# Patient Record
Sex: Female | Born: 1970 | Race: White | Hispanic: No | Marital: Married | Smoking: Never smoker
Health system: Southern US, Community
[De-identification: ages and names within clinical notes are randomized; demographics above are authoritative.]

## PROBLEM LIST (undated history)

## (undated) DIAGNOSIS — G4700 Insomnia, unspecified: Secondary | ICD-10-CM

## (undated) DIAGNOSIS — R030 Elevated blood-pressure reading, without diagnosis of hypertension: Secondary | ICD-10-CM

## (undated) DIAGNOSIS — Z8742 Personal history of other diseases of the female genital tract: Secondary | ICD-10-CM

## (undated) DIAGNOSIS — F419 Anxiety disorder, unspecified: Secondary | ICD-10-CM

## (undated) DIAGNOSIS — Z8719 Personal history of other diseases of the digestive system: Secondary | ICD-10-CM

## (undated) DIAGNOSIS — M4316 Spondylolisthesis, lumbar region: Secondary | ICD-10-CM

## (undated) DIAGNOSIS — N3010 Interstitial cystitis (chronic) without hematuria: Secondary | ICD-10-CM

## (undated) DIAGNOSIS — R112 Nausea with vomiting, unspecified: Secondary | ICD-10-CM

## (undated) DIAGNOSIS — E781 Pure hyperglyceridemia: Secondary | ICD-10-CM

## (undated) DIAGNOSIS — Z9889 Other specified postprocedural states: Secondary | ICD-10-CM

## (undated) DIAGNOSIS — J302 Other seasonal allergic rhinitis: Secondary | ICD-10-CM

## (undated) DIAGNOSIS — R399 Unspecified symptoms and signs involving the genitourinary system: Secondary | ICD-10-CM

## (undated) DIAGNOSIS — R3989 Other symptoms and signs involving the genitourinary system: Secondary | ICD-10-CM

## (undated) DIAGNOSIS — E785 Hyperlipidemia, unspecified: Secondary | ICD-10-CM

## (undated) DIAGNOSIS — K219 Gastro-esophageal reflux disease without esophagitis: Secondary | ICD-10-CM

## (undated) DIAGNOSIS — E559 Vitamin D deficiency, unspecified: Secondary | ICD-10-CM

## (undated) HISTORY — DX: Anxiety disorder, unspecified: F41.9

## (undated) HISTORY — DX: Hyperlipidemia, unspecified: E78.5

## (undated) HISTORY — DX: Insomnia, unspecified: G47.00

---

## 1994-07-15 HISTORY — PX: OTHER SURGICAL HISTORY: SHX169

## 1998-09-01 ENCOUNTER — Encounter: Payer: Self-pay | Admitting: Obstetrics and Gynecology

## 1998-09-01 ENCOUNTER — Ambulatory Visit (HOSPITAL_COMMUNITY): Admission: RE | Admit: 1998-09-01 | Discharge: 1998-09-01 | Payer: Self-pay | Admitting: Obstetrics and Gynecology

## 2000-04-23 ENCOUNTER — Other Ambulatory Visit: Admission: RE | Admit: 2000-04-23 | Discharge: 2000-04-23 | Payer: Self-pay | Admitting: Gynecology

## 2000-08-25 ENCOUNTER — Other Ambulatory Visit: Admission: RE | Admit: 2000-08-25 | Discharge: 2000-08-25 | Payer: Self-pay | Admitting: Gynecology

## 2001-03-31 ENCOUNTER — Ambulatory Visit (HOSPITAL_COMMUNITY): Admission: RE | Admit: 2001-03-31 | Discharge: 2001-03-31 | Payer: Self-pay | Admitting: Obstetrics and Gynecology

## 2001-03-31 ENCOUNTER — Encounter: Payer: Self-pay | Admitting: Obstetrics and Gynecology

## 2001-07-22 ENCOUNTER — Inpatient Hospital Stay (HOSPITAL_COMMUNITY): Admission: AD | Admit: 2001-07-22 | Discharge: 2001-07-22 | Payer: Self-pay | Admitting: Obstetrics and Gynecology

## 2001-07-29 ENCOUNTER — Inpatient Hospital Stay (HOSPITAL_COMMUNITY): Admission: AD | Admit: 2001-07-29 | Discharge: 2001-07-29 | Payer: Self-pay | Admitting: Obstetrics and Gynecology

## 2001-08-16 ENCOUNTER — Inpatient Hospital Stay (HOSPITAL_COMMUNITY): Admission: AD | Admit: 2001-08-16 | Discharge: 2001-08-18 | Payer: Self-pay | Admitting: Obstetrics and Gynecology

## 2001-09-25 ENCOUNTER — Other Ambulatory Visit: Admission: RE | Admit: 2001-09-25 | Discharge: 2001-09-25 | Payer: Self-pay | Admitting: Obstetrics and Gynecology

## 2002-12-30 ENCOUNTER — Other Ambulatory Visit: Admission: RE | Admit: 2002-12-30 | Discharge: 2002-12-30 | Payer: Self-pay | Admitting: Obstetrics and Gynecology

## 2003-01-04 ENCOUNTER — Encounter: Admission: RE | Admit: 2003-01-04 | Discharge: 2003-01-04 | Payer: Self-pay | Admitting: Obstetrics and Gynecology

## 2003-01-04 ENCOUNTER — Encounter: Payer: Self-pay | Admitting: Obstetrics and Gynecology

## 2004-01-13 ENCOUNTER — Ambulatory Visit (HOSPITAL_COMMUNITY): Admission: RE | Admit: 2004-01-13 | Discharge: 2004-01-13 | Payer: Self-pay | Admitting: Obstetrics and Gynecology

## 2004-01-13 ENCOUNTER — Encounter (INDEPENDENT_AMBULATORY_CARE_PROVIDER_SITE_OTHER): Payer: Self-pay | Admitting: Specialist

## 2004-01-13 HISTORY — PX: DILITATION & CURRETTAGE/HYSTROSCOPY WITH NOVASURE ABLATION: SHX5568

## 2004-01-31 ENCOUNTER — Other Ambulatory Visit: Admission: RE | Admit: 2004-01-31 | Discharge: 2004-01-31 | Payer: Self-pay | Admitting: Obstetrics and Gynecology

## 2004-05-21 ENCOUNTER — Emergency Department (HOSPITAL_COMMUNITY): Admission: EM | Admit: 2004-05-21 | Discharge: 2004-05-21 | Payer: Self-pay

## 2005-10-24 ENCOUNTER — Emergency Department (HOSPITAL_COMMUNITY): Admission: EM | Admit: 2005-10-24 | Discharge: 2005-10-25 | Payer: Self-pay | Admitting: Emergency Medicine

## 2005-11-21 ENCOUNTER — Emergency Department (HOSPITAL_COMMUNITY): Admission: EM | Admit: 2005-11-21 | Discharge: 2005-11-22 | Payer: Self-pay | Admitting: Emergency Medicine

## 2005-11-22 ENCOUNTER — Other Ambulatory Visit (HOSPITAL_COMMUNITY): Admission: RE | Admit: 2005-11-22 | Discharge: 2005-12-06 | Payer: Self-pay | Admitting: Psychiatry

## 2005-11-22 ENCOUNTER — Ambulatory Visit: Payer: Self-pay | Admitting: Psychiatry

## 2005-12-11 ENCOUNTER — Ambulatory Visit (HOSPITAL_COMMUNITY): Payer: Self-pay | Admitting: Psychiatry

## 2005-12-30 ENCOUNTER — Ambulatory Visit (HOSPITAL_COMMUNITY): Payer: Self-pay | Admitting: Psychiatry

## 2006-01-27 ENCOUNTER — Ambulatory Visit (HOSPITAL_COMMUNITY): Payer: Self-pay | Admitting: Psychiatry

## 2006-04-03 ENCOUNTER — Other Ambulatory Visit: Admission: RE | Admit: 2006-04-03 | Discharge: 2006-04-03 | Payer: Self-pay | Admitting: Obstetrics and Gynecology

## 2006-05-16 ENCOUNTER — Ambulatory Visit (HOSPITAL_COMMUNITY): Payer: Self-pay | Admitting: Psychiatry

## 2006-09-05 ENCOUNTER — Inpatient Hospital Stay (HOSPITAL_COMMUNITY): Admission: AD | Admit: 2006-09-05 | Discharge: 2006-09-08 | Payer: Self-pay | Admitting: Obstetrics and Gynecology

## 2006-09-05 ENCOUNTER — Encounter (INDEPENDENT_AMBULATORY_CARE_PROVIDER_SITE_OTHER): Payer: Self-pay | Admitting: Specialist

## 2006-09-05 ENCOUNTER — Encounter: Payer: Self-pay | Admitting: Emergency Medicine

## 2006-09-06 HISTORY — PX: TOTAL ABDOMINAL HYSTERECTOMY W/ BILATERAL SALPINGOOPHORECTOMY: SHX83

## 2006-10-13 ENCOUNTER — Ambulatory Visit (HOSPITAL_COMMUNITY): Payer: Self-pay | Admitting: Psychiatry

## 2007-01-05 ENCOUNTER — Ambulatory Visit (HOSPITAL_COMMUNITY): Payer: Self-pay | Admitting: Psychiatry

## 2007-02-11 ENCOUNTER — Ambulatory Visit (HOSPITAL_COMMUNITY): Payer: Self-pay | Admitting: Psychiatry

## 2007-03-04 ENCOUNTER — Emergency Department (HOSPITAL_COMMUNITY): Admission: EM | Admit: 2007-03-04 | Discharge: 2007-03-04 | Payer: Self-pay | Admitting: Emergency Medicine

## 2007-08-05 ENCOUNTER — Ambulatory Visit (HOSPITAL_COMMUNITY): Payer: Self-pay | Admitting: Psychiatry

## 2007-10-02 ENCOUNTER — Encounter: Admission: RE | Admit: 2007-10-02 | Discharge: 2007-10-02 | Payer: Self-pay | Admitting: Orthopaedic Surgery

## 2007-10-23 ENCOUNTER — Encounter: Admission: RE | Admit: 2007-10-23 | Discharge: 2007-10-23 | Payer: Self-pay | Admitting: Orthopaedic Surgery

## 2008-02-08 ENCOUNTER — Ambulatory Visit (HOSPITAL_COMMUNITY): Payer: Self-pay | Admitting: Psychiatry

## 2008-02-09 HISTORY — PX: LUMBAR FUSION: SHX111

## 2009-09-12 ENCOUNTER — Encounter: Payer: Self-pay | Admitting: Family Medicine

## 2009-09-22 ENCOUNTER — Ambulatory Visit (HOSPITAL_BASED_OUTPATIENT_CLINIC_OR_DEPARTMENT_OTHER): Admission: RE | Admit: 2009-09-22 | Discharge: 2009-09-22 | Payer: Self-pay | Admitting: Orthopedic Surgery

## 2009-09-22 HISTORY — PX: CARPAL TUNNEL WITH CUBITAL TUNNEL: SHX5608

## 2009-11-30 ENCOUNTER — Encounter: Admission: RE | Admit: 2009-11-30 | Discharge: 2009-11-30 | Payer: Self-pay | Admitting: Obstetrics and Gynecology

## 2010-08-05 ENCOUNTER — Encounter: Payer: Self-pay | Admitting: Family Medicine

## 2010-09-25 ENCOUNTER — Ambulatory Visit: Payer: Self-pay | Admitting: Family Medicine

## 2010-09-28 ENCOUNTER — Encounter: Payer: Self-pay | Admitting: Family Medicine

## 2010-09-28 ENCOUNTER — Ambulatory Visit (INDEPENDENT_AMBULATORY_CARE_PROVIDER_SITE_OTHER): Payer: Self-pay | Admitting: Family Medicine

## 2010-09-28 DIAGNOSIS — N83209 Unspecified ovarian cyst, unspecified side: Secondary | ICD-10-CM | POA: Insufficient documentation

## 2010-09-28 DIAGNOSIS — F411 Generalized anxiety disorder: Secondary | ICD-10-CM

## 2010-09-28 DIAGNOSIS — E781 Pure hyperglyceridemia: Secondary | ICD-10-CM | POA: Insufficient documentation

## 2010-09-28 DIAGNOSIS — N3010 Interstitial cystitis (chronic) without hematuria: Secondary | ICD-10-CM | POA: Insufficient documentation

## 2010-09-28 DIAGNOSIS — G43909 Migraine, unspecified, not intractable, without status migrainosus: Secondary | ICD-10-CM | POA: Insufficient documentation

## 2010-09-28 DIAGNOSIS — K219 Gastro-esophageal reflux disease without esophagitis: Secondary | ICD-10-CM

## 2010-09-28 DIAGNOSIS — E663 Overweight: Secondary | ICD-10-CM | POA: Insufficient documentation

## 2010-09-28 DIAGNOSIS — R0789 Other chest pain: Secondary | ICD-10-CM | POA: Insufficient documentation

## 2010-10-01 ENCOUNTER — Encounter: Payer: Self-pay | Admitting: Family Medicine
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
## 2010-10-07 LAB — POCT HEMOGLOBIN-HEMACUE: Hemoglobin: 12.6 g/dL (ref 12.0–15.0)

## 2010-10-08 ENCOUNTER — Telehealth: Payer: Self-pay | Admitting: Family Medicine

## 2010-10-08 MED ORDER — FLUCONAZOLE 150 MG PO TABS: 150.0000 mg | ORAL_TABLET | Freq: Once | ORAL | Status: AC

## 2010-10-08 NOTE — Telephone Encounter (Signed)
Pt has developed a yeast infection from antibiotics, pls call in a Rx to [facility]

## 2010-10-08 NOTE — Telephone Encounter (Signed)
Please advise 

## 2010-10-08 NOTE — Telephone Encounter (Signed)
OK, have prescribed Diflucan please notify patient and encourage her to take a probiotic daily for the next couple of weeks, OTC

## 2010-10-09 NOTE — Telephone Encounter (Signed)
Pt informed

## 2010-10-09 NOTE — Telephone Encounter (Signed)
Message copied by Lannette Donath on Tue Oct 09, 2010 10:48 AM ------      Message from: Danise Edge      Created: Fri Oct 05, 2010  1:03 PM      Regarding: RE: Brenton Grills      Contact: [phone]       Paps, mgms and labs for last 5 years, any surgeries      ----- Message -----         From: Lannette Donath         Sent: 10/04/2010   4:37 PM           To: Danise Edge      Subject: [facility]                                               Bonita Quin from medical records at [facility] called and said their medical records date back to 1995, there are A LOT of records, do you want all of them? I told her I would call her back to let her know.

## 2010-10-29 ENCOUNTER — Other Ambulatory Visit (HOSPITAL_COMMUNITY)
Admission: RE | Admit: 2010-10-29 | Discharge: 2010-10-29 | Disposition: A | Discharge status: Home / Self Care | Payer: BC Managed Care – PPO | Source: Ambulatory Visit | Attending: Family Medicine | Admitting: Family Medicine

## 2010-10-29 ENCOUNTER — Ambulatory Visit (INDEPENDENT_AMBULATORY_CARE_PROVIDER_SITE_OTHER): Payer: BC Managed Care – PPO | Admitting: Family Medicine

## 2010-10-29 ENCOUNTER — Encounter: Payer: Self-pay | Admitting: Family Medicine

## 2010-10-29 DIAGNOSIS — Z01419 Encounter for gynecological examination (general) (routine) without abnormal findings: Secondary | ICD-10-CM | POA: Insufficient documentation

## 2010-10-29 DIAGNOSIS — N3010 Interstitial cystitis (chronic) without hematuria: Secondary | ICD-10-CM

## 2010-10-29 DIAGNOSIS — Z0000 Encounter for general adult medical examination without abnormal findings: Secondary | ICD-10-CM | POA: Insufficient documentation

## 2010-10-29 DIAGNOSIS — F411 Generalized anxiety disorder: Secondary | ICD-10-CM

## 2010-10-29 DIAGNOSIS — K219 Gastro-esophageal reflux disease without esophagitis: Secondary | ICD-10-CM

## 2010-10-29 DIAGNOSIS — E781 Pure hyperglyceridemia: Secondary | ICD-10-CM

## 2010-10-29 DIAGNOSIS — IMO0001 Reserved for inherently not codable concepts without codable children: Secondary | ICD-10-CM

## 2010-10-29 DIAGNOSIS — F419 Anxiety disorder, unspecified: Secondary | ICD-10-CM

## 2010-10-29 DIAGNOSIS — E663 Overweight: Secondary | ICD-10-CM

## 2010-10-29 DIAGNOSIS — G43909 Migraine, unspecified, not intractable, without status migrainosus: Secondary | ICD-10-CM

## 2010-10-29 DIAGNOSIS — N83209 Unspecified ovarian cyst, unspecified side: Secondary | ICD-10-CM

## 2010-10-29 DIAGNOSIS — E785 Hyperlipidemia, unspecified: Secondary | ICD-10-CM

## 2010-10-29 DIAGNOSIS — R0789 Other chest pain: Secondary | ICD-10-CM

## 2010-10-29 LAB — CBC WITH DIFFERENTIAL/PLATELET
Basophils Absolute: 0 10*3/uL (ref 0.0–0.1)
Basophils Relative: 0.4 % (ref 0.0–3.0)
Eosinophils Absolute: 0.1 10*3/uL (ref 0.0–0.7)
Lymphocytes Relative: 36.9 % (ref 12.0–46.0)
MCV: 92.2 fl (ref 78.0–100.0)
Monocytes Absolute: 0.4 10*3/uL (ref 0.1–1.0)
Monocytes Relative: 5.2 % (ref 3.0–12.0)
RBC: 4.13 Mil/uL (ref 3.87–5.11)
RDW: 12.8 % (ref 11.5–14.6)

## 2010-10-29 LAB — RENAL FUNCTION PANEL
CO2: 27 mEq/L (ref 19–32)
Calcium: 9.3 mg/dL (ref 8.4–10.5)
Chloride: 105 mEq/L (ref 96–112)
Creatinine, Ser: 0.8 mg/dL (ref 0.4–1.2)
GFR: 89.9 mL/min (ref >60.00)
Glucose, Bld: 84 mg/dL (ref 70–99)

## 2010-10-29 LAB — LIPID PANEL
HDL: 53.2 mg/dL (ref >39.00)
Triglycerides: 233 mg/dL — ABNORMAL HIGH (ref 0.0–149.0)
VLDL: 46.6 mg/dL — ABNORMAL HIGH (ref 0.0–40.0)

## 2010-10-29 LAB — HEPATIC FUNCTION PANEL
Albumin: 3.8 g/dL (ref 3.5–5.2)
Alkaline Phosphatase: 37 U/L — ABNORMAL LOW (ref 39–117)
Total Protein: 7 g/dL (ref 6.0–8.3)

## 2010-10-29 LAB — LDL CHOLESTEROL, DIRECT: Direct LDL: 146 mg/dL

## 2010-10-29 LAB — TSH: TSH: 0.71 u[IU]/mL (ref 0.35–5.50)

## 2010-10-29 MED ORDER — ALPRAZOLAM 0.25 MG PO TABS: 0.2500 mg | ORAL_TABLET | Freq: Two times a day (BID) | ORAL | Status: DC | PRN

## 2010-10-29 NOTE — Assessment & Plan Note (Signed)
Good improvement with addition of Atenolol. Only 1 HA since last seen, will continue current dosing, reiterated lifestyle issues such as sleep and diet as well. Call with any concerns

## 2010-10-29 NOTE — Assessment & Plan Note (Signed)
Doing well, did use OTC Zantac with good results rarely since last visit, encouraged her to continue the same. Avoid offending foods.

## 2010-10-29 NOTE — Assessment & Plan Note (Addendum)
Pap taken today, will proceed with vaginal exam every 3 years if pap is normal and patient without any new concerns. Encouraged MGM every 1-2 years after age 40 yrs patient in agreement

## 2010-10-29 NOTE — Progress Notes (Signed)
Brittney Townsend [id number] 1970/10/05 10/29/2010      Progress Note-Follow Up  Subjective  Chief Complaint  Chief Complaint  Patient presents with  . Gynecologic Exam    pap smear    HPI  Patient is a 40 year old female in today for follow up of multiple medical concerns and annual Pap smear. Has not had a Pap smear in a number of years secondary to a total hysterectomy. Did have some abnormal Pap smears ago required colposcopy but ultimately had her total abdominal hysterectomy for endometriosis and painful ovarian cysts. She was on oral contraceptives for roughly 3-5 years. Since her last visit she had she feels significantly better. Her migraines have dropped to 1 for less than a month. She feels less anxious she denies any chest pain. Her heartburn is well-controlled. No recent febrile illness, chills, chest pain, palpitations, shortness of breath, GI or GU complaints at this time. She's been trying to avoid offending foods and taking her Tessalon with good effect. She says that Tylenol has helped her headaches dramatically but Imitrex still works when she needs it. She is eating very little alprazolam for her anxiety when she has needed it has been effective without any concerning side effects.  Past Medical History  Diagnosis Date  . Allergy     seasonal  . Anxiety   . Migraines     Past Surgical History  Procedure Date  . Abdominal hysterectomy 2008    total  . Back surgery 2009    L5 fracture required pins, rod, fusion L4-L5 fusion  . Elbow surgery 2011    carpal tunnel release on right  . Cesarean section 1998, 2003    Family History  Problem Relation Age of Onset  . Hypertension Mother   . Depression Mother   . Hyperlipidemia Father   . Diabetes Father     type 2  . Cancer Maternal Grandmother     Lung/ previous smoker  . Hypertension Maternal Grandmother   . Hyperlipidemia Maternal Grandmother   . Other Maternal Grandfather     renal failure  . Heart disease  Maternal Grandfather   . Hypertension Maternal Grandfather   . Hyperlipidemia Maternal Grandfather   . Diabetes Paternal Grandmother   . Hypertension Paternal Grandmother   . Hyperlipidemia Paternal Grandmother   . Hypertension Paternal Grandfather   . Hyperlipidemia Paternal Grandfather   . Cancer Paternal Grandfather     skin  . Depression Brother   . Obesity Son   . Heart attack Paternal Aunt     smoker  . Heart attack Paternal Uncle     smoker    History   Social History  . Marital Status: Married    Spouse Name: N/A    Number of Children: N/A  . Years of Education: N/A   Occupational History  . Not on file.   Social History Main Topics  . Smoking status: Never Smoker   . Smokeless tobacco: Never Used  . Alcohol Use: Yes     rare, special occasions  . Drug Use: No  . Sexually Active: Yes -- Female partner(s)   Other Topics Concern  . Not on file   Social History Narrative  . No narrative on file    Current Outpatient Prescriptions on File Prior to Visit  Medication Sig Dispense Refill  . atenolol (TENORMIN) 25 MG tablet Take 25 mg by mouth daily. 1/2 to 1 tab po daily as tolerated       . clindamycin-benzoyl  peroxide (BENZACLIN) gel Apply topically daily. Apply to lesions daily as tolerated       . dexlansoprazole (DEXILANT) 60 MG capsule Take 60 mg by mouth daily.        Marland Kitchen estrogens, conjugated, (PREMARIN) 1.25 MG tablet Take 1.25 mg by mouth daily.        . Meth-Hyo-M Bl-Na Phos-Ph Sal (PHOSPHASAL) 81.6 MG TABS Take by mouth daily.        . minocycline (MINOCIN,DYNACIN) 50 MG capsule Take 50 mg by mouth 2 (two) times daily.        . pentosan polysulfate (ELMIRON) 100 MG capsule Take 100 mg by mouth 3 (three) times daily before meals.        . Probiotic Product (PRO-FLORA CONCENTRATE) CAPS Take 1 capsule by mouth daily.        Enid Baas & Cleansers (CETAPHIL GENTLE CLEANSING) BAR Apply topically. Wash daily affected       . SUMAtriptan (IMITREX) 100 MG tablet  Take 100 mg by mouth every 2 (two) hours as needed.        . traZODone (DESYREL) 50 MG tablet Take 50 mg by mouth at bedtime.        Marland Kitchen DISCONTD: ALPRAZolam (XANAX) 0.25 MG tablet Take 0.25 mg by mouth 2 (two) times daily as needed. 1/2 to 1 tab po bid prn       . escitalopram (LEXAPRO) 10 MG tablet Take 10 mg by mouth daily. 1/2 tab po daily X 7 days then quit          Allergies  Allergen Reactions  . Sulfonamide Derivatives     REACTION: hives    Review of Systems  Review of Systems  Constitutional: Negative for fever and malaise/fatigue.  HENT: Negative for congestion.   Eyes: Negative for discharge.  Respiratory: Negative for shortness of breath.   Cardiovascular: Negative for chest pain, palpitations and leg swelling.  Gastrointestinal: Negative for nausea, vomiting, abdominal pain, diarrhea, constipation and blood in stool.  Genitourinary: Negative for dysuria, urgency, frequency, hematuria and flank pain.       [G2P2 s/p 2 C/S. S/p hysterectomy in 2007 for painful ruptured ovarian cysts and endometriosis, menarche at 40 yo and regular but painful and heavy in past. Distant h/o abnormal pap normalized after coloposcopy and never recurred. Has had a couple of MGM for a lump found by other MD, normal on MGM and no breast or GYN c/o today. No discharge or lesions, sexually active only with husband Musculoskeletal: Negative for falls.  Skin: Negative for rash.  Neurological: Negative for loss of consciousness and headaches.  Endo/Heme/Allergies: Negative for polydipsia.  Psychiatric/Behavioral: Negative for depression and suicidal ideas. The patient is not nervous/anxious and does not have insomnia.     Objective  BP 126/80  Pulse 79  Temp(Src) 98 F (36.7 C) (Oral)  Ht 5\' 3"  (1.6 m)  Wt 161 lb (73.029 kg)  BMI 28.52 kg/m2  SpO2 100%  Physical Exam  Physical Exam  Constitutional: She is oriented to person, place, and time and well-developed, well-nourished, and in no  distress. No distress.  HENT:  Head: Normocephalic and atraumatic.  Eyes: Conjunctivae are normal.  Neck: Neck supple. No thyromegaly present.  Cardiovascular: Normal rate, regular rhythm and normal heart sounds.   No murmur heard. Pulmonary/Chest: Effort normal and breath sounds normal. She has no wheezes.  Abdominal: She exhibits no distension and no mass.  Genitourinary: Vagina normal, right adnexa normal and left adnexa normal. No vaginal discharge  found.       No cervix or uterus noted by visualization or palpation  Musculoskeletal: She exhibits no edema.  Lymphadenopathy:    She has no cervical adenopathy.  Neurological: She is alert and oriented to person, place, and time.  Skin: Skin is warm and dry. No rash noted. She is not diaphoretic.  Psychiatric: Memory, affect and judgment normal.      Assessment & Plan   INTERSTITIAL CYSTITIS Asymptomatic at this time, encouraged good hydration and avoid harsh soaps, call with  Any concerns  MIGRAINE HEADACHE Good improvement with addition of Atenolol. Only 1 HA since last seen, will continue current dosing, reiterated lifestyle issues such as sleep and diet as well. Call with any concerns  HYPERTRIGLYCERIDEMIA Reviewed labs from previous PMD with patient and encouraged to avoid trans fats and increase exercise, start fish oil and fiber supplements. Check FLP today  GERD Doing well, did use OTC Zantac with good results rarely since last visit, encouraged her to continue the same. Avoid offending foods.  Preventative health care Pat taken today, will proceed with vaginal exam every 3 years if pap is normal and patient without any new concerns. Encouraged MGM every 1-2 years after age 12 yrs patient in agreement   Anxiety Disorder Symptoms improved at this time, continue current meds, use Alprazolam infrequently prn

## 2010-10-29 NOTE — Assessment & Plan Note (Signed)
Reviewed labs from previous PMD with patient and encouraged to avoid trans fats and increase exercise, start fish oil and fiber supplements. Check FLP today

## 2010-10-29 NOTE — Patient Instructions (Signed)
Hypertriglyceridemia  Diet for High blood levels of Triglycerides Most fats in food are triglycerides. Triglycerides in your blood are stored as fat in your body. High levels of triglycerides in your blood may put you at a greater risk for heart disease and stroke.  Normal triglyceride levels are less than 150 mg/dL. Borderline high levels are 150-199 mg/dl. High levels are 200 - 499 mg/dL, and very high triglyceride levels are greater than 500 mg/dL. The decision to treat high triglycerides is generally based on the level. For people with borderline or high triglyceride levels, treatment includes weight loss and exercise. Drugs are recommended for people with very high triglyceride levels. Many people who need treatment for high triglyceride levels have metabolic syndrome. This syndrome is a collection of disorders that often include: insulin resistance, high blood pressure, blood clotting problems, high cholesterol and triglycerides. TESTING PROCEDURE FOR TRIGLYCERIDES  You should not eat 4 hours before getting your triglycerides measured. The normal range of triglycerides is between 10 and 250 milligrams per deciliter (mg/dl). Some people may have extreme levels (1000 or above), but your triglyceride level may be too high if it is above 150 mg/dl, depending on what other risk factors you have for heart disease.   People with high blood triglycerides may also have high blood cholesterol levels. If you have high blood cholesterol as well as high blood triglycerides, your risk for heart disease is probably greater than if you only had high triglycerides. High blood cholesterol is one of the main risk factors for heart disease.  CHANGING YOUR DIET  Your weight can affect your blood triglyceride level. If you are more than 20% above your ideal body weight, you may be able to lower your blood triglycerides by losing weight. Eating less and exercising regularly is the best way to combat this. Fat provides  more calories than any other food. The best way to lose weight is to eat less fat. Only 30% of your total calories should come from fat. Less than 7% of your diet should come from saturated fat. A diet low in fat and saturated fat is the same as a diet to decrease blood cholesterol. By eating a diet lower in fat, you may lose weight, lower your blood cholesterol, and lower your blood triglyceride level.  Eating a diet low in fat, especially saturated fat, may also help you lower your blood triglyceride level. Ask your dietitian to help you figure how much fat you can eat based on the number of calories your caregiver has prescribed for you.  Exercise, in addition to helping with weight loss may also help lower triglyceride levels.   Alcohol can increase blood triglycerides. You may need to stop drinking alcoholic beverages.   Too much carbohydrate in your diet may also increase your blood triglycerides. Some complex carbohydrates are necessary in your diet. These may include bread, rice, potatoes, other starchy vegetables and cereals.   Reduce "simple" carbohydrates. These may include pure sugars, candy, honey, and jelly without losing other nutrients. If you have the kind of high blood triglycerides that is affected by the amount of carbohydrates in your diet, you will need to eat less sugar and less high-sugar foods. Your caregiver can help you with this.   Adding 2-4 grams of fish oil (EPA+ DHA) may also help lower triglycerides. Speak with your caregiver before adding any supplements to your regimen.  Following the Diet  Maintain your ideal weight. Your caregivers can help you with a diet. Generally,   eating less food and getting more exercise will help you lose weight. Joining a weight control group may also help. Ask your caregivers for a good weight control group in your area.  Eat low-fat foods instead of high-fat foods. This can help you lose weight too.  These foods are lower in fat. Eat MORE  of these:   Dried beans, peas, and lentils.   Egg whites.   Low-fat cottage cheese.   Fish.   Lean cuts of meat, such as round, sirloin, rump, and flank (cut extra fat off meat you fix).   Whole grain breads, cereals and pasta.   Skim and nonfat dry milk.   Low-fat yogurt.   Poultry without the skin.   Cheese made with skim or part-skim milk, such as mozzarella, parmesan, farmers', ricotta, or pot cheese.   These are higher fat foods. Eat LESS of these:   Whole milk and foods made from whole milk, such as American, blue, cheddar, monterey jack, and swiss cheese   High-fat meats, such as luncheon meats, sausages, knockwurst, bratwurst, hot dogs, ribs, corned beef, ground pork, and regular ground beef.   Fried foods.  Limit saturated fats in your diet. Substituting unsaturated fat for saturated fat may decrease your blood triglyceride level. You will need to read package labels to know which products contain saturated fats.  These foods are high in saturated fat. Eat LESS of these:   Fried pork skins.  Whole milk.   Skin and fat from poultry.   Palm oil.   Butter.   Shortening.   Cream cheese.   Berniece Salines.   Margarines and baked goods made from listed oils.   Vegetable shortenings.   Chitterlings.  Fat from meats.   Coconut oil.   Palm kernel oil.   Lard.   Cream.   Sour cream.   Fatback.   Coffee whiteners and non-dairy creamers made with these oils.   Cheese made from whole milk.   Use unsaturated fats (both polyunsaturated and monounsaturated) moderately. Remember, even though unsaturated fats are better than saturated fats; you still want a diet low in total fat.  These foods are high in unsaturated fat:   Canola oil.  Sunflower oil.   Mayonnaise.   Almonds.   Peanuts.   Pine nuts.   Margarines made with these oils.   Safflower oil.  Olive oil.   Avocados.   Cashews.   Peanut butter.   Sunflower seeds.   Soybean  oil.  Peanut oil.   Olives.   Pecans.   Walnuts.   Pumpkin seeds.   Avoid sugar and other high-sugar foods. This will decrease carbohydrates without decreasing other nutrients. Sugar in your food goes rapidly to your blood. When there is excess sugar in your blood, your liver may use it to make more triglycerides. Sugar also contains calories without other important nutrients.  Eat LESS of these:   Sugar, brown sugar, powdered sugar, jam, jelly, preserves, honey, syrup, molasses, pies, candy, cakes, cookies, frosting, pastries, colas, soft drinks, punches, fruit drinks, and regular gelatin.   Avoid alcohol. Alcohol, even more than sugar, may increase blood triglycerides. In addition, alcohol is high in calories and low in nutrients. Ask for sparkling water, or a diet soft drink instead of an alcoholic beverage.  Suggestions for planning and preparing meals   Bake, broil, grill or roast meats instead of frying.   Remove fat from meats and skin from poultry before cooking.   Add spices, herbs, lemon juice  or vinegar to vegetables instead of salt, rich sauces or gravies.   Use a non-stick skillet without fat or use no-stick sprays.   Cool and refrigerate stews and broth. Then remove the hardened fat floating on the surface before serving.   Refrigerate meat drippings and skim off fat to make low-fat gravies.   Serve more fish.   Use less butter, margarine and other high-fat spreads on bread or vegetables.   Use skim or reconstituted non-fat dry milk for cooking.   Cook with low-fat cheeses.   Substitute low-fat yogurt or cottage cheese for all or part of the sour cream in recipes for sauces, dips or congealed salads.   Use half yogurt/half mayonnaise in salad recipes.   Substitute evaporated skim milk for cream. Evaporated skim milk or reconstituted non-fat dry milk can be whipped and substituted for whipped cream in certain recipes.   Choose fresh fruits for dessert instead  of high-fat foods such as pies or cakes. Fruits are naturally low in fat.  When Dining Out   Order low-fat appetizers such as fruit or vegetable juice, pasta with vegetables or tomato sauce.   Select clear, rather than cream soups.   Ask that dressings and gravies be served on the side. Then use less of them.   Order foods that are baked, broiled, poached, steamed, stir-fried, or roasted.   Ask for margarine instead of butter, and use only a small amount.   Drink sparkling water, unsweetened tea or coffee, or diet soft drinks instead of alcohol or other sweet beverages.  QUESTIONS AND ANSWERS ABOUT OTHER FATS IN THE BLOOD:  SATURATED FAT, TRANS FAT, AND CHOLESTEROL What is trans fat? Trans fat is a type of fat that is formed when vegetable oil is hardened through a process called hydrogenation. This process helps makes foods more solid, gives them shape, and prolongs their shelf life. Trans fats are also called hydrogenated or partially hydrogenated oils.  What do saturated fat, trans fat, and cholesterol in foods have to do with heart disease? Saturated fat, trans fat, and cholesterol in the diet all raise the level of LDL "bad" cholesterol in the blood. The higher the LDL cholesterol, the greater the risk for coronary heart disease (CHD). Saturated fat and trans fat raise LDL similarly.  What foods contain saturated fat, trans fat, and cholesterol? High amounts of saturated fat are found in animal products, such as fatty cuts of meat, chicken skin, and full-fat dairy products like butter, whole milk, cream, and cheese, and in tropical vegetable oils such as palm, palm kernel, and coconut oil. Trans fat is found in some of the same foods as saturated fat, such as vegetable shortening, some margarines (especially hard or stick margarine), crackers, cookies, baked goods, fried foods, salad dressings, and other processed foods made with partially hydrogenated vegetable oils. Small amounts of trans  fat also occur naturally in some animal products, such as milk products, beef, and lamb. Foods high in cholesterol include liver, other organ meats, egg yolks, shrimp, and full-fat dairy products. How can I use the new food label to make heart-healthy food choices? Check the Nutrition Facts panel of the food label. Choose foods lower in saturated fat, trans fat, and cholesterol. For saturated fat and cholesterol, you can also use the Percent Daily Value (%DV): 5% DV or less is low, and 20% DV or more is high. (There is no %DV for trans fat.) Use the Nutrition Facts panel to choose foods low in saturated fat   and cholesterol, and if the trans fat is not listed, read the ingredients and limit products that list shortening or hydrogenated or partially hydrogenated vegetable oil, which tend to be high in trans fat. POINTS TO REMEMBER: YOU NEED A LITTLE TLC (THERAPEUTIC LIFESTYLE CHANGES)  Discuss your risk for heart disease with your caregivers, and take steps to reduce risk factors.   Change your diet. Choose foods that are low in saturated fat, trans fat, and cholesterol.   Add exercise to your daily routine if it is not already being done. Participate in physical activity of moderate intensity, like brisk walking, for at least 30 minutes on most, and preferably all days of the week. No time? Break the 30 minutes into three, 10-minute segments during the day.   Stop smoking. If you do smoke, contact your caregiver to discuss ways in which they can help you quit.   Do not use street drugs.   Maintain a normal weight.   Maintain a healthy blood pressure.   Keep up with your blood work for checking the fats in your blood as directed by your caregiver.  Document Released: 04/18/2004 Document Re-Released: 12/19/2009 [facility] Patient Information 2011 [place], [place].  Avoid trans fats/partially hydrogenated oils, consider taking a fish oil cap daily and a fiber supplement such as Benefiber daily as  well

## 2010-10-29 NOTE — Assessment & Plan Note (Signed)
Asymptomatic at this time, encouraged good hydration and avoid harsh soaps, call with  Any concerns

## 2010-11-02 ENCOUNTER — Encounter: Payer: Self-pay | Admitting: Family Medicine

## 2010-11-02 DIAGNOSIS — F419 Anxiety disorder, unspecified: Secondary | ICD-10-CM | POA: Insufficient documentation

## 2010-11-02 NOTE — Assessment & Plan Note (Signed)
Patient is s/p TAH for this and h/o endometriosis

## 2010-11-02 NOTE — Assessment & Plan Note (Signed)
Is doing better in this regard and has used Alprazolam very infrequently since last visit, will maintain current meds and change only as needed

## 2010-11-02 NOTE — Assessment & Plan Note (Signed)
Encouraged avoid trans fats and simple carbs, maintain hi activity level and minimize po in take with small, frequent meals

## 2010-11-02 NOTE — Assessment & Plan Note (Signed)
Patient is doing better in this regard and does not c/o any episodes since her last visit with the treatment of reflux and anxiety. We will continue to monitor her symptoms and she will call if they worsen

## 2010-11-02 NOTE — Assessment & Plan Note (Signed)
Improved at present, cont Lexapro and may continue to sue Alprazolam prn

## 2010-11-13 ENCOUNTER — Encounter: Payer: Self-pay | Admitting: Family Medicine

## 2010-11-15 ENCOUNTER — Encounter: Payer: Self-pay | Admitting: Family Medicine

## 2010-11-30 ENCOUNTER — Ambulatory Visit: Payer: BC Managed Care – PPO | Admitting: Family Medicine

## 2010-11-30 DIAGNOSIS — Z0289 Encounter for other administrative examinations: Secondary | ICD-10-CM

## 2010-11-30 NOTE — Discharge Summary (Signed)
[facility]  Patient:    Brittney Townsend, Brittney Townsend Visit Number: [id number] MRN: [id number]          Service Type: OBS Location: [id number] Attending Physician:  Oliver Pila Dictated by:   Alvino Chapel, M.D. Admit Date:  08/16/2001 Discharge Date: 08/18/2001                             Discharge Summary  DISCHARGE DIAGNOSES:          1. Term pregnancy at 38+ weeks.                               2. History of previous cesarean section.                               3. History of preterm labor in previous                                  pregnancy.                               4. Status post repeat low transverse cesarean                                  section.  DISCHARGE MEDICATIONS:        1. Percocet one to two tablets p.o. every four                                  hours p.r.n.                               2. Motrin 600 mg p.o. every six hours.  FOLLOW-UP:                    The patient is to follow up in my office in three to four days for stable removal and in six weeks for her routine postpartum examination.  HOSPITAL COURSE:              The patient is a 40 year old, gravida 2, para 0-1-0-1, who was admitted at 38+ weeks in early labor with contractions every two to five minutes.  The cervix had shown some change and was 2 to 3 cm, 80%, and -1 station.  Pregnancy had been complicated by previous cesarean section for fetal distress at 34 weeks and the patient desired a repeat cesarean section at this time.  Last pregnancy had preterm labor which was treated with bed rest and in this pregnancy she had not required that and had no preterm labor. 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  PRENATAL LABORATORY DATA:     O positive, antibody negative, RPR nonreactive, rubella immune, hepatitis B surface antigen negative, GC negative, Chlamydia negative, GBS positive, triple screen normal, one-hour Glucola normal.  PAST OBSTETRIC HISTORY:        In 1998 she had a 34-week infant with a low transverse cesarean section, 5 pounds 9 ounces.  PAST GYN  HISTORY:             In 2001 she had laparoscopic resection of endometriosis and removal of a cyst on her right tube.  In 1995, she had laparoscopy for endometriosis.  PAST SURGICAL HISTORY:        Laparoscopy x2 as stated and conization in 1990.  PAST MEDICAL HISTORY:         Reflux disease and history of interstitial cystitis.  The patient has no tobacco or alcohol use and her husband is a Company secretary in town.  ALLERGIES:                    SULFA, TERBUTALINE.  PHYSICAL EXAMINATION:         VITAL SIGNS: She was afebrile with stable vital signs.  Fetal heart rate was reactive.  Estimated fetal weight was 7-1/2 to 8 pounds.  Given her early labor, and slow cervical change, the patient was offered to proceed with her repeat cesarean section at this time and declined a trial of labor.  HOSPITAL COURSE:              Risks and benefits were discussed with the patient and she underwent a repeat low transverse cesarean section without difficulty and was delivered of a vigorous female infant, Apgars were 8 and 9, weight was 7 pounds 7 ounces.  Postoperatively she did well.  Her postoperative hemoglobin was 9, down from 10.6.  By postoperative day #2, she was tolerating a regular diet.  Her pain was well controlled.  Her incision was clean, dry, and intact, and had no erythema.  Therefore she was discharged to home to follow up in the office in approximately three to four days for staple removal. Dictated by:   Alvino Chapel, M.D. Attending Physician:  Oliver Pila DD:  08/31/01 TD:  08/31/01 Job: [id number]

## 2010-11-30 NOTE — Op Note (Signed)
[facility]  Patient:    Brittney Townsend, Brittney Townsend Visit Number: [id number] MRN: [id number]          Service Type: OBS Location: *N Attending Physician:  Michaele Offer Dictated by:   Alvino Chapel, M.D. Proc. Date: 08/16/01                             Operative Report  PREOPERATIVE DIAGNOSES:       1. Term pregnancy at 38 plus weeks.                               2. Previous cesarean section.                               3. History of preterm labor.  POSTOPERATIVE DIAGNOSIS:      Repeat low transverse cesarean section.  OPERATION:                    Repeat low transverse cesarean section.  SURGEON:                      Alvino Chapel, M.D.  ANESTHESIA:                   Spinal.  ESTIMATED BLOOD LOSS:         800 cc.  URINE OUTPUT:                 125 cc concentrated.  IV FLUIDS:                    2000 cc of LR.  FINDINGS:                     There is a vigorous female infant in the vertex presentation.  Apgars are 8 and 9.  Weight 7 pounds and 7 ounces.  Tubes and ovaries were within normal limits.  DESCRIPTION OF PROCEDURE:     The patient was taken to the operating room where spinal anesthesia was obtained without difficulty.  She was then prepped and draped in a normal sterile fashion in the dorsal supine position with a leftward tilt.  A Pfannenstiel skin incision was then made through a preexisting scar and carried through to the underlying layer of fascia by sharp dissection and Bovie cautery.  The fascia was then nicked in the midline and extended laterally with Mayo scissors.  The Kocher clamps were then utilized to elevate the lower aspect of the incision and this was dissected from the rectus muscles with Bovie cautery.  In a similar fashion, the superior aspect of the fascia was grasped with Kocher clamps, elevated, and dissected off the underlying rectus muscles.  The rectus muscles were then separated in the  midline.  The peritoneum identified and then entered bluntly. The peritoneal incision was then extended both superiorly and inferiorly with careful attention to avoid both bowel and bladder.  The bladder blade was inserted.  The vesicouterine peritoneum identified and the bladder flap created with Metzenbaum scissors.  The bladder flap was then pushed away from the lower uterine segment and the bladder blade replaced.  The lower uterine segment was then incised in a transverse fashion and the cavity entered bluntly.  The uterine incision was extended bluntly  and the infants head was delivered with the assistance of vacuum without difficulty.  The nose and mouth were bulb suctioned and the remainder of the infant delivered with the cord clamped and handed off to the pediatricians.  The cord blood was obtained and the placenta then expressed manually.  The uterus was cleared of all clots and debris with a moist lap sponge.  The angles were grasped with the ring forceps and the uterine incision was then closed with 0 chromic in a running locked fashion.  Good hemostasis was noted, and the tubes and ovaries were inspected bilaterally and found to be normal.  The bladder flap and uterine incisions appeared to be hemostatic.  Therefore, the subfascial layer was inspected with two small areas of bleeding cauterized with Bovie cautery.  The fascia was then closed with 0 Vicryl in a running fashion.  The subcutaneous tissue was reapproximated with 0 plain in a running suture and the skin was closed with staples.  Sponge, lap, and needle counts were correct x2, and the patient was taken to the recovery room in stable condition. Dictated by:   Alvino Chapel, M.D. Attending Physician:  Michaele Offer DD:  08/16/01 TD:  08/17/01 Job: [phone] [id number]

## 2010-11-30 NOTE — Op Note (Signed)
Brittney Townsend, Brittney NO.:  [id number]   MEDICAL RECORD NO.:  [id number]          PATIENT TYPE:  MAT   LOCATION:  MATC                          [facility]ACILITY:  [facility]   PHYSICIAN:  Sherron Monday, MD        DATE OF BIRTH:  02-11-1971   DATE OF PROCEDURE:  09/06/2006  DATE OF DISCHARGE:                               OPERATIVE REPORT   PREOPERATIVE DIAGNOSES:  1. Seven-by-seven centimeter endometrioma, ruptured.  2. Severe pelvic pain.   POSTOPERATIVE DIAGNOSES:  1. Seven-by-seven centimeter endometrioma, ruptured.  2. Severe pelvic pain.   OPERATION PERFORMED:  1. Total abdominal hysterectomy.  2. Bilateral salpingo-oophorectomy.  3. Exploratory laparotomy.   SURGEON:  Sherron Monday, M.D.   ASSISTANT:  Huel Cote, M.D.   ANESTHESIA:  General.   COMPLICATIONS:  None.   PATHOLOGY:  Uterus, tubes and ovaries.   ESTIMATED BLOOD LOSS:  The EBL was 400 mL.   FLUIDS REPLACED:  Intravenous fluids; 3,000 mL.   FLUID OUTPUT:  Urine output 200 mL of clear urine at the end of the  surgery.   FINDINGS:  Normal-appearing uterus with adhesions at her cesarean  section site with the right ovary revealing a 7 cm endometrioma that was  settled into the cul-de-sac and noted to be ruptured.  There were fine  adhesions to the bowel and the side wall.  The left ovary was also  adhesed to the side wall.  These adhesions were easily ligated with  blunt dissection. Blood in pelvis consistent with rupture of  endometrioma.   DISPOSITION:  The patient was stable to the post-anesthesia care unit  following the surgery.   DESCRIPTION OF THE OPERATION:  After informed consent was reviewed with  the patient including the risks, benefits and alternatives of  this  surgical procedure she was transported in stable condition to the  operating room. On discussion the patient requested definitive  management and understood the risks of surgical menopause.  The patient  was placed on  the table in the supine position.  General anesthesia was  induced and found to be adequate.  A Foley catheter was then sterilely  placed.  She was prepped and draped in the normal sterile fashion.   A Pfannenstiel skin incision was made at the level of the her previous  Pfannenstiel incision.  This was carried through to the underlying layer  of the fascia with the Bovie.  The fascia was then incised in the  midline and the incision was extended laterally with Mayo scissors.  The  inferior aspect of the fascial incision was grasped with the Kocher  clamps and elevated. The rectus muscles were dissected off bluntly and  sharply.   Attention was then turned to the superior portion, which in a similar  fashion was elevated and the rectus muscles were dissected off both  bluntly and sharply.  The peritoneum was inadvertently entered during  this and it was confirmed that the endometrioma had likely ruptured.  This incision was extended noting a thin peritoneum and this was  extended.  The colon was  quickly examined and no injury was noted.   The pelvis was irrigated and some of the clots were removed.  It was  noted that there was a significant amount of dark blood, clotted, within  the pelvis similar to a chocolate cyst confirming the endometrioma  rupture.  The pelvis was then palpated revealing a normal-sized uterus  and the 7 x 7 cm endometrioma was noted to be in the cul-de-sac, and  finely adhesed.  These adhesions were lysed with blunt dissection.   The attention was then turned to performing the hysterectomy.  The  uterus was grasped with long Kelly clamps and elevated.  The right round  ligament was ligated with 0-Vicryl and incised using Bovie cautery.  The  bladder flap was created using Bovie cautery and dissection was  performed using the Metzenbaum scissors.  The endometrioma was then  delivered from the cul-de-sac and the ureter was identified.  The  endometrioma, right  ovary and tube were excised using parametrium  clamps.  After the ureter had been easily identified the IP pedicle was  ligated.   Attention was then turned to the left side.  In a similar fashion the  round ligament was ligated.  The adhesions were bluntly dissected.  The  ovary was excised also using parametrium clamps and the IP  was ligated  aftert the ureter was identified.   Attention was then turned to ligating the uterine arteries. The right  uterine artery was clamped with fully curved parametrium clamp and was  ligated using a Heaney stitch.  Hemostasis was assured.  In a similar  fashion this procedure was performed on the left side. The uterus was  then, in a stepwise fashion, separated from the parametrium with a close  eye on the ureter.  The cervix was easily palpable and the cuff was  created using clamps at the angles, in which the corners were held and  the intervening area was closed with a Heaney stitch as well.  Hemostasis was obtained using Bovie cautery and using a small amount of  3-0 Vicryl at the cuff.  The area of the peritoneal tear was repaired.  Copious irrigation was performed.  The peritoneum and muscles were  reapproximated, after copious irrigation had been performed, using 0-  Vicryl.  The fascia was then closed with 0-Vicryl in a running fashion.  The first layer was made hemostatic with the bovie cautery.  A  subcuticular adipose stitch was placed and the skin was closed with  staples.   The patient tolerated the procedure well.   COUNTS:  Sponge, lap and needle counts were correct times two at the end  of the procedure per the operating room staff.     Sherron Monday, MD  Electronically Signed    JB/MEDQ  D:  09/06/2006  T:  09/06/2006  Job:  [id number]

## 2010-11-30 NOTE — H&P (Signed)
NAME:  Brittney Townsend, Brittney Townsend                           ACCOUNT NO.:  [id number]   MEDICAL RECORD NO.:  [id number]                   PATIENT TYPE:  AMB   LOCATION:  SDC                                  [facility]ACILITY:  [facility]   PHYSICIAN:  Huel Cote, M.D.              DATE OF BIRTH:  01/18/1971   DATE OF ADMISSION:  DATE OF DISCHARGE:                                HISTORY & PHYSICAL   DATE OF SCHEDULED SURGERY:  January 13, 2004   HISTORY OF PRESENT ILLNESS:  The patient is a 40 year old G2 P2 who is  scheduled to come in for a Novasure endometrial ablation given an ongoing  history of extensive abnormal uterine bleeding which has had a negative  workup in terms of vaginal ultrasound and blood workup.  The patient  currently had bleeding for approximately the entire month of March and April  with only 8-10 days of no bleeding each month.  This was slightly improved  in May and June with 10- to 12-day long periods.  However, the patient still  had excessive bleeding for her which she wishes treatment for.  She had a  sonohysterogram which showed a thin endometrial stripe of 2.1 mm and no  polyps or fibroids noted in the cavity and is felt to be a good candidate  for the Novasure endometrial ablation.   PAST OBSTETRICAL HISTORY:  Significant for C-section x2.   PAST GYNECOLOGICAL HISTORY:  In 1995 and 2001 she had a laparoscopy with  endometriosis noted and a history of a conization in the past with normal  Pap smears since that time.   PAST SURGICAL HISTORY:  C-section x2, laparoscopy x2, and a conization.   PAST MEDICAL HISTORY:  Reflux disease and interstitial cystitis.   FAMILY HISTORY:  No breast cancer.  She does have a maternal grandmother  with colon polyps.   PHYSICAL EXAMINATION:  VITAL SIGNS:  Blood pressure is 130/80, weight is 154  pounds.  CARDIAC:  Regular rate and rhythm.  LUNGS:  Clear.  ABDOMEN:  Soft and nontender.  PELVIC:  She has a normal pelvic exam with cervix that  is slightly flush  with the vagina, given her past history of conization.  The uterus is normal  in size.  Adnexa have no masses.   Given the patient's abnormal uterine bleeding she had been given a trial of  oral contraceptives in the past which she did not tolerate well secondary to  nausea and spotting.  She was then switched to the Ortho Evra patch which  likewise caused undesirable side effects of nausea and spotting.  Other  options have been discussed with the patient including IUD and Nuvaring.  However, she is not enthusiastic about any further hormonal treatment given  the negative side effects she experiences on these medications.  The risks  and benefits of endometrial ablation were discussed with the patient in  detail including  bleeding, infection, and possible damage to bowel and  bladder.  The patient understands these risks and desires to proceed with  the surgery as stated.  The patient was also instructed to  have no unprotected intercourse for 2 weeks prior to the surgery at which  point a urine pregnancy test will be performed to confirm that there is no  possibility of pregnancy.  She understands that pregnancy would not  necessarily test positive for the first 10 days after conception and is  agreeable to abstaining until this time.                                               Huel Cote, M.D.    KR/MEDQ  D:  01/11/2004  T:  01/11/2004  Job:  [id number]

## 2010-11-30 NOTE — Op Note (Signed)
NAME:  Brittney Townsend, Brittney Townsend                           ACCOUNT NO.:  [id number]   MEDICAL RECORD NO.:  [id number]                   PATIENT TYPE:  AMB   LOCATION:  SDC                                  [facility]   PHYSICIAN:  Huel Cote, M.D.              DATE OF BIRTH:  25-Jun-1971   DATE OF PROCEDURE:  01/13/2004  DATE OF DISCHARGE:                                 OPERATIVE REPORT   PREOPERATIVE DIAGNOSES:  1. Menorrhagia.  2. Abnormal uterine bleeding.   POSTOPERATIVE DIAGNOSES:  1. Menorrhagia.  2. Abnormal uterine bleeding.   PROCEDURE:  Novasure endometrial ablation and dilation with curettage.   SURGEON:  Huel Cote, M.D.   ANESTHESIA:  MAC.   FINDINGS:  The cavity sounds to approximately 8 cm.  Cervix was 2.5 cm.  The  cavity width was 4.3 cm and depth 5.5.  The patient had moderate tissue  obtained on a pretreatment curettage.  Cervix was noted to be slightly flush  with the vagina and mildly stenotic, consistent with her prior cervical  procedures.   PROCEDURE:  The patient was taken to the operating room, where [facility]  anesthesia was obtained without difficulty.  She was the prepped and draped  in the dorsal lithotomy position in the normal sterile fashion.  A speculum  was placed within the vagina, the cervix grasped with a single-tooth  tenaculum on the anterior lip, and the uterus sounded to approximately 8 cm.  The cervix itself was slightly stenotic; therefore, the Hegar dilator would  not easily introduce into the cervical os.  A Pratt dilator was successfully  utilized to measure the cervical length at approximately 2.5.  The cavity  set was then placed at 5.5 and after dilation to approximately 25 with the  [facility] dilators, the Novasure unit was introduced into the uterine cavity and  gently pulled back from the posterior fundus.  The unit was then deployed by  pulling backwards on the release trigger and the cavity width adjusted by  manipulation of  the unit in several angles.  The cavity width was  approximately 4.3.  The cervical sealer was then put in place and because  the tenaculum was in the way, the tenaculum was removed and a single tap  performed to enable the unit, which passed the enabling mode.  The unit was  then deployed and a treatment time of approximately a minute and 20 seconds  was noted.  At the conclusion of the procedure the Novasure unit was  released and carefully removed from the uterine fundus.  There was no active  bleeding noted, and the patient was taken to the recovery room, awakened in  and in stable condition.  Huel Cote, M.D.    KR/MEDQ  D:  01/13/2004  T:  01/13/2004  Job:  [id number]

## 2010-11-30 NOTE — Discharge Summary (Signed)
NAMEBENTLY, MORATH                 ACCOUNT NO.:  [id number]   MEDICAL RECORD NO.:  [id number]          PATIENT TYPE:  INP   LOCATION:  9309                          [facility]ACILITY:  [facility]   PHYSICIAN:  Sherron Monday, MD        DATE OF BIRTH:  07-01-1971   DATE OF ADMISSION:  09/05/2006  DATE OF DISCHARGE:  09/08/2006                               DISCHARGE SUMMARY   ADMISSION DIAGNOSES:  1. Pelvic pain.  2. Persistent ovarian cyst.  3. Endometriosis.   DISCHARGE DIAGNOSES:  1. Pelvic pain.  2. Persistent ovarian cyst.  3. Endometriosis.  4. Status post total abdominal hysterectomy with bilateral salpingo-      oophorectomy secondary to endometriosis and endometrioma.   HISTORY OF PRESENT ILLNESS:  A 40 year old G2, P1-1-0-2 with abdominal  pain and an ovarian cyst.  She denies fever, states she has had nausea  and vomiting.  Described the pain as a constant sharp pain, greater than  10/10.  She states the pain had been present since 12:30 a.m. and it  increased throughout the day.  In the past, she has had pain similar to  this but she describes this as much worse.   PAST MEDICAL HISTORY:  1. GERD.  2. Depression.   PAST SURGICAL HISTORY:  1. Cesarean section x2.  2. Operative laparoscopy which diagnosed her endometriosis.   OBSTETRICS/GYNECOLOGICAL HISTORY:  1. G1 was a preterm cesarean section for fetal stress for female infant.  2. G2 was a term cesarean section as a repeat.  3. History of abnormal Pap smear with a colposcopy, normal since.  4. No sexually transmitted diseases.  5. Endometriosis.   MEDICATIONS:  1. Prilosec.  2. Zoloft.  3. Lamictal.   ALLERGIES:  SULFA which causes hives.   SOCIAL HISTORY:  She denies alcohol, tobacco or drug use.  She is  married.   FAMILY HISTORY:  Significant for hypertension, coronary artery disease  in all of her grandparents.  Maternal grandmother with lung cancer.  No  diabetes.  Mother, maternal grandmother with hysterectomy  secondary to  problems of endometriosis.   PHYSICAL EXAMINATION ON ADMISSION:  GENERAL APPEARANCE:  No apparent  distress.  VITAL SIGNS:  Afebrile with tachycardic, otherwise stable vital signs.  LUNGS:  Clear to auscultation bilaterally.  CARDIOVASCULAR:  Regular rate and rhythm.  ABDOMEN:  Soft with good bowel sounds and diffusely tender to palpation.  She has guarding and no rebound.  PELVIC:  Normal external female genitalia, normal BUS.  Cervix and  vagina without lesions.  She has cervical motion tenderness.  Her uterus  is within normal limits.  Her right adnexa is palpable on rectovaginal  exam and noted to be enlarged and cystic feeling.  EXTREMITIES:  Symmetric and nontender.   She was seen at [facility] overnight and her endometrioma was  stable as to what it had been in April, the last time that she had pain  and on follow-up ultrasound it was 7 x 7 cm.  She had poor follow-up in  the office and had not done her  scheduled follow-up.   UA was negative.  Urine pregnancy test was negative.  White count was  20.9, hemoglobin 13.5, platelets 250,000.  Sodium 137, potassium 4,  chloride 105, CO2 24, BUN 10, creatinine 0.79, glucose 152.   ASSESSMENT/PLAN:  A 40 year old G2, P1-1-0-2 with ovarian  cyst/endometrioma and severe abdominal pain.  Discussed with the patient  risks, benefits, and alternatives of total abdominal hysterectomy and  bilateral salpingo-oophorectomy including risks of continued pain and  inability to bear children, bleeding, pain and damage to surrounding  organs, risks of surgical menopause.  Discussed with the patient goal of  retaining one ovary.  She voiced understanding to all of this and wished  to proceed with definitive management.   Her hospital course was uncomplicated.  She was initially given a PCA  for pain control.  She was able to ambulate, voiding well, had a good  amount of urine output.  She was discharged to home on  postoperative day  #2.  At this time, she is ambulating well, tolerating p.o., voiding well  and desired to go home.   DISCHARGE MEDICATIONS:  She was given prescriptions for Motrin, Vicodin  and Premarin for hormone replacement when she starts having hot flashes.  This was discussed at length with her and she desired to take a pill  rather than do a patch and she understands that this might not be  sufficient to control her symptoms.  She will call with any problems.   She is going to follow up with [place] in approximately two weeks and given  numbers to call if any pain or problems as well as routine discharge  instructions.  She is also given a note for work.      Sherron Monday, MD  Electronically Signed     JB/MEDQ  D:  09/08/2006  T:  09/08/2006  Job:  [id number]

## 2010-12-05 ENCOUNTER — Other Ambulatory Visit: Payer: Self-pay

## 2010-12-05 MED ORDER — ESTROGENS CONJUGATED 1.25 MG PO TABS: 1.2500 mg | ORAL_TABLET | Freq: Every day | ORAL | Status: DC

## 2010-12-12 ENCOUNTER — Ambulatory Visit (INDEPENDENT_AMBULATORY_CARE_PROVIDER_SITE_OTHER): Payer: BC Managed Care – PPO | Admitting: Family Medicine

## 2010-12-12 ENCOUNTER — Encounter: Payer: Self-pay | Admitting: Family Medicine

## 2010-12-12 DIAGNOSIS — J309 Allergic rhinitis, unspecified: Secondary | ICD-10-CM

## 2010-12-12 DIAGNOSIS — Z9109 Other allergy status, other than to drugs and biological substances: Secondary | ICD-10-CM

## 2010-12-12 DIAGNOSIS — M25569 Pain in unspecified knee: Secondary | ICD-10-CM

## 2010-12-12 DIAGNOSIS — M25562 Pain in left knee: Secondary | ICD-10-CM

## 2010-12-12 DIAGNOSIS — J019 Acute sinusitis, unspecified: Secondary | ICD-10-CM

## 2010-12-12 MED ORDER — CIPROFLOXACIN HCL 500 MG PO TABS: 500.0000 mg | ORAL_TABLET | Freq: Two times a day (BID) | ORAL | Status: DC

## 2010-12-12 MED ORDER — CETIRIZINE HCL 10 MG PO CAPS: 1.0000 | ORAL_CAPSULE | Freq: Every day | ORAL | Status: DC

## 2010-12-12 MED ORDER — SALINE 0.65 % NA SOLN: 1.0000 | NASAL | Status: AC | PRN

## 2010-12-12 MED ORDER — NAPROXEN 375 MG PO TABS: 375.0000 mg | ORAL_TABLET | Freq: Two times a day (BID) | ORAL | Status: AC | PRN

## 2010-12-12 MED ORDER — GUAIFENESIN ER 600 MG PO TB12: 600.0000 mg | ORAL_TABLET | Freq: Two times a day (BID) | ORAL | Status: DC

## 2010-12-12 NOTE — Progress Notes (Signed)
Brittney Townsend [id number] 1971-05-06 12/12/2010      Progress Note-Follow Up  Subjective  Chief Complaint  Chief Complaint  Patient presents with  . Nasal Congestion    X 2 weeks  . Otalgia    both ears- left worse  . Fatigue    HPI  Patient is a 25 a Caucasian female in today with a roughly two-week history of pain respiratory symptoms. She was struggling with a cough and some shortness of breath as well as postnasal drip and green sputum production but a week and half ago went to an urgent care center. She was prescribed albuterol and some codeine-based cough syrup and some cough and chest congestion largely improved. Unfortunately she continued to struggle some nausea nasal pressure clear rhinorrhea and ear pain. She still struggling with fatigue does still have some mild chest congestion but not as notable. Her chest tightness is decreased although she does still have some mild shortness of breath. She is presently denying fevers, chills, diarrhea, chest pain, palpitations. Her other complaint is of some itchy watery nose and eyes at times and she is exposed to secondhand smoke. Finally she is complaining of some left knee pain which has been present for one to 2 months she describes it as an achy symptoms always there is not responding to over-the-counter Tylenol. It is not affected by position or use. There does feel like this and swelling most notably in the back and she denies any history of distant or recent trauma.  Past Medical History  Diagnosis Date  . Allergy     seasonal  . Anxiety   . Migraines   . Overweight 09/28/2010  . Other and unspecified ovarian cyst 09/28/2010  . MIGRAINE HEADACHE 09/28/2010  . 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 HYPERTRIGLYCERIDEMIA 09/28/2010  . GERD 09/28/2010  . CHEST PAIN, ATYPICAL 09/28/2010  . ANXIETY DISORDER 09/28/2010  . Anxiety disorder 11/02/2010  . Knee pain, left 12/12/2010  . Sinusitis acute 12/12/2010  . Environmental allergies  12/12/2010    Past Surgical History  Procedure Date  . Abdominal hysterectomy 2008    total  . Back surgery 2009    L5 fracture required pins, rod, fusion L4-L5 fusion  . Elbow surgery 2011    carpal tunnel release on right  . Cesarean section 1998, 2003    Family History  Problem Relation Age of Onset  . Hypertension Mother   . Depression Mother   . Hyperlipidemia Father   . Diabetes Father     type 2  . Cancer Maternal Grandmother     Lung/ previous smoker  . Hypertension Maternal Grandmother   . Hyperlipidemia Maternal Grandmother   . Other Maternal Grandfather     renal failure  . Heart disease Maternal Grandfather   . Hypertension Maternal Grandfather   . Hyperlipidemia Maternal Grandfather   . Diabetes Paternal Grandmother   . Hypertension Paternal Grandmother   . Hyperlipidemia Paternal Grandmother   . Hypertension Paternal Grandfather   . Hyperlipidemia Paternal Grandfather   . Cancer Paternal Grandfather     skin  . Depression Brother   . Obesity Son   . Heart attack Paternal Aunt     smoker  . Heart attack Paternal Uncle     smoker    History   Social History  . Marital Status: Married    Spouse Name: N/A    Number of Children: N/A  . Years of Education: N/A   Occupational History  . Not on  file.   Social History Main Topics  . Smoking status: Never Smoker   . Smokeless tobacco: Never Used  . Alcohol Use: Yes     rare, special occasions  . Drug Use: No  . Sexually Active: Yes -- Female partner(s)   Other Topics Concern  . Not on file   Social History Narrative  . No narrative on file    Current Outpatient Prescriptions on File Prior to Visit  Medication Sig Dispense Refill  . ALPRAZolam (XANAX) 0.25 MG tablet Take 1 tablet (0.25 mg total) by mouth 2 (two) times daily as needed. 1/2 to 1 tab po bid prn  20 tablet  1  . atenolol (TENORMIN) 25 MG tablet Take 25 mg by mouth daily. 1/2 to 1 tab po daily as tolerated       .  clindamycin-benzoyl peroxide (BENZACLIN) gel Apply topically daily. Apply to lesions daily as tolerated       . dexlansoprazole (DEXILANT) 60 MG capsule Take 60 mg by mouth daily.        Marland Kitchen escitalopram (LEXAPRO) 10 MG tablet Take 10 mg by mouth daily. 1/2 tab po daily X 7 days then quit        . estrogens, conjugated, (PREMARIN) 1.25 MG tablet Take 1 tablet (1.25 mg total) by mouth daily.  30 tablet  3  . Meth-Hyo-M Bl-Na Phos-Ph Sal (PHOSPHASAL) 81.6 MG TABS Take by mouth daily.        . minocycline (MINOCIN,DYNACIN) 50 MG capsule Take 50 mg by mouth 2 (two) times daily.        . pentosan polysulfate (ELMIRON) 100 MG capsule Take 100 mg by mouth 3 (three) times daily before meals.        . Probiotic Product (PRO-FLORA CONCENTRATE) CAPS Take 1 capsule by mouth daily.        [employer] & Cleansers (CETAPHIL GENTLE CLEANSING) BAR Apply topically. Wash daily affected       . SUMAtriptan (IMITREX) 100 MG tablet Take 100 mg by mouth every 2 (two) hours as needed.        . traZODone (DESYREL) 50 MG tablet Take 50 mg by mouth at bedtime.          Allergies  Allergen Reactions  . Sulfonamide Derivatives     REACTION: hives    Review of Systems  Review of Systems  Constitutional: Negative for fever and malaise/fatigue.  HENT: Positive for ear pain and congestion. Negative for sore throat and ear discharge.   Eyes: Negative for discharge.  Respiratory: Positive for cough. Negative for shortness of breath.   Cardiovascular: Negative for chest pain, palpitations and leg swelling.  Gastrointestinal: Negative for nausea, abdominal pain and diarrhea.  Genitourinary: Negative for dysuria.  Musculoskeletal: Negative for falls.  Skin: Negative for rash.  Neurological: Negative for loss of consciousness and headaches.  Endo/Heme/Allergies: Negative for polydipsia.  Psychiatric/Behavioral: Negative for depression and suicidal ideas. The patient is not nervous/anxious and does not have insomnia.      Objective  BP 137/85  Pulse 78  Temp(Src) 98.1 F (36.7 C) (Oral)  Ht 5\' 3"  (1.6 m)  Wt 163 lb 1.9 oz (73.991 kg)  BMI 28.90 kg/m2  SpO2 99%  Physical Exam  Physical Exam  Constitutional: She is oriented to person, place, and time and well-developed, well-nourished, and in no distress. No distress.  HENT:  Head: Normocephalic and atraumatic.  Mouth/Throat: Oropharynx is clear and moist.  Eyes: Conjunctivae are normal.  Neck: Neck supple.  No thyromegaly present.       Nasal mucosa boggy and erythematous  Cardiovascular: Normal rate, regular rhythm and normal heart sounds.   No murmur heard. Pulmonary/Chest: Effort normal and breath sounds normal. She has no wheezes.  Abdominal: She exhibits no distension and no mass.  Musculoskeletal: She exhibits no edema.  Lymphadenopathy:    She has no cervical adenopathy.  Neurological: She is alert and oriented to person, place, and time.  Skin: Skin is warm and dry. No rash noted. She is not diaphoretic.  Psychiatric: Memory, affect and judgment normal.    Lab Results  Component Value Date   TSH 0.71 10/29/2010   Lab Results  Component Value Date   WBC 6.9 10/29/2010   HGB 13.1 10/29/2010   HCT 38.1 10/29/2010   MCV 92.2 10/29/2010   PLT 204.0 10/29/2010   Lab Results  Component Value Date   CREATININE 0.8 10/29/2010   BUN 15 10/29/2010   NA 140 10/29/2010   K 4.4 10/29/2010   CL 105 10/29/2010   CO2 27 10/29/2010   Lab Results  Component Value Date   ALT 16 10/29/2010   AST 18 10/29/2010   ALKPHOS 37* 10/29/2010   BILITOT 0.3 10/29/2010   Lab Results  Component Value Date   CHOL 222* 10/29/2010   Lab Results  Component Value Date   HDL 53.20 10/29/2010   No results found for this basename: [facility]   Lab Results  Component Value Date   TRIG 233.0* 10/29/2010   Lab Results  Component Value Date   CHOLHDL 4 10/29/2010     Assessment & Plan  Sinusitis acute Patient with a roughly two-week history of worsening  nasal congestion and sinus pressure. He was seen a week and half ago at an urgent care center and thought to have bronchitis was given an inhaler and a cough medicine and her chest congestion improved proportionally sinus pressure and ear pressure pressure worsened. At this point she seems to have a sinusitis we will start on ciprofloxacin 500 mg twice a day aspirin take Mucinex twice a day to use nasal saline and antihistamines daily and to report if symptoms worsen or any concerns. While on antibiotics needs to take a probiotic and antibiotic daily.  Knee pain, left One to 2 month history of aching pain in her left knee only. No obvious traumatic incident but some mild swelling noted suspect either early arthritic changes versus Baker's cyst. No obvious M. Lally on today's exam. She is asked to start that as a supplement such as fish oil daily try Aspercreme, ice and we will check an x-ray or his symptoms worsen or do not improve.  Environmental allergies Patient exposed to second hand smoke and some trouble with seasonal allergies, encouraged her take an OTC antihistamine daily

## 2010-12-12 NOTE — Patient Instructions (Signed)

## 2010-12-12 NOTE — Assessment & Plan Note (Signed)
One to 2 month history of aching pain in her left knee only. No obvious traumatic incident but some mild swelling noted suspect either early arthritic changes versus Baker's cyst. No obvious M. Lally on today's exam. She is asked to start that as a supplement such as fish oil daily try Aspercreme, ice and we will check an x-ray or his symptoms worsen or do not improve.

## 2010-12-12 NOTE — Assessment & Plan Note (Signed)
Patient exposed to second hand smoke and some trouble with seasonal allergies, encouraged her take an OTC antihistamine daily

## 2010-12-12 NOTE — Assessment & Plan Note (Signed)
Patient with a roughly two-week history of worsening nasal congestion and sinus pressure. He was seen a week and half ago at an urgent care center and thought to have bronchitis was given an inhaler and a cough medicine and her chest congestion improved proportionally sinus pressure and ear pressure pressure worsened. At this point she seems to have a sinusitis we will start on ciprofloxacin 500 mg twice a day aspirin take Mucinex twice a day to use nasal saline and antihistamines daily and to report if symptoms worsen or any concerns. While on antibiotics needs to take a probiotic and antibiotic daily.

## 2010-12-18 ENCOUNTER — Ambulatory Visit (HOSPITAL_BASED_OUTPATIENT_CLINIC_OR_DEPARTMENT_OTHER)
Admission: RE | Admit: 2010-12-18 | Discharge: 2010-12-18 | Disposition: A | Discharge status: Home / Self Care | Payer: BC Managed Care – PPO | Source: Ambulatory Visit | Attending: Family Medicine | Admitting: Family Medicine

## 2010-12-18 ENCOUNTER — Other Ambulatory Visit: Payer: Self-pay

## 2010-12-18 DIAGNOSIS — M25569 Pain in unspecified knee: Secondary | ICD-10-CM | POA: Insufficient documentation

## 2010-12-18 DIAGNOSIS — M25562 Pain in left knee: Secondary | ICD-10-CM

## 2010-12-18 MED ORDER — FLUCONAZOLE 150 MG PO TABS: 150.0000 mg | ORAL_TABLET | ORAL | Status: DC

## 2010-12-18 NOTE — Telephone Encounter (Signed)
OK to refill with same sig and number of tabs as last rx and 1 refill

## 2010-12-21 ENCOUNTER — Telehealth: Payer: Self-pay

## 2010-12-21 MED ORDER — METRONIDAZOLE 500 MG PO TABS: 500.0000 mg | ORAL_TABLET | Freq: Two times a day (BID) | ORAL | Status: DC

## 2010-12-21 NOTE — Telephone Encounter (Signed)
She can try a course of Flagyl 500mg  po bid x 5 days, if no relief needs to come in for further testing

## 2010-12-21 NOTE — Telephone Encounter (Signed)
Pt called stating Diflucan isn't working. Pt would like Flagyl called into pharmacy. Pt made an appt for Monday morning in case Flagyl wouldn't work? Please advise? pts call back [phone]

## 2010-12-24 ENCOUNTER — Ambulatory Visit (INDEPENDENT_AMBULATORY_CARE_PROVIDER_SITE_OTHER): Payer: BC Managed Care – PPO | Admitting: Family Medicine

## 2010-12-24 ENCOUNTER — Encounter: Payer: Self-pay | Admitting: Family Medicine

## 2010-12-24 ENCOUNTER — Other Ambulatory Visit (HOSPITAL_COMMUNITY)
Admission: RE | Admit: 2010-12-24 | Discharge: 2010-12-24 | Disposition: A | Discharge status: Home / Self Care | Payer: BC Managed Care – PPO | Source: Ambulatory Visit | Attending: Family Medicine | Admitting: Family Medicine

## 2010-12-24 VITALS — BP 136/78 | HR 78 | Temp 97.9°F | Ht 63.0 in | Wt 165.4 lb

## 2010-12-24 DIAGNOSIS — N760 Acute vaginitis: Secondary | ICD-10-CM | POA: Insufficient documentation

## 2010-12-24 DIAGNOSIS — K219 Gastro-esophageal reflux disease without esophagitis: Secondary | ICD-10-CM

## 2010-12-24 DIAGNOSIS — R52 Pain, unspecified: Secondary | ICD-10-CM

## 2010-12-24 DIAGNOSIS — N3010 Interstitial cystitis (chronic) without hematuria: Secondary | ICD-10-CM

## 2010-12-24 LAB — POCT URINALYSIS DIPSTICK
Protein, UA: NEGATIVE
Spec Grav, UA: 1.02
Urobilinogen, UA: 0.2

## 2010-12-24 MED ORDER — FLUCONAZOLE 150 MG PO TABS: ORAL_TABLET | ORAL | Status: DC

## 2010-12-24 NOTE — Patient Instructions (Signed)
Vaginitis Vaginitis in a soreness, swelling and redness (inflammation) of the vagina and vulva. This is not a sexually transmitted infection.   CAUSES Yeast vaginitis is caused by yeast (candida) that is normally found in your vagina. With a yeast infection, the candida has over grown in number to a point that upsets the chemical balance. SYMPTOMS   White thick vaginal discharge.   Swelling, itching, redness and irritation of the vagina and possibly the lips of the vagina (vulva).   Burning or painful urination.   Painful intercourse.  HOME CARE INSTRUCTIONS  Finish all medication as prescribed.   Do not have sex until treatment is completed or instructed by your healthcare giver.   Take warm sitz baths.   Do not douche.   Do not use tampons, especially scented ones.   Wear cotton underwear.   Avoid tight pants and panty hose.   Tell your sexual partner that you have a yeast infection. They should go to their caregiver if they have symptoms such as mild rash or itching.   Your sexual partner should be treated if your infection is difficult to eliminate.   Practice safer sex. Use condoms.   Some vaginal medications cause latex condoms to fail. Ask your caregiver this.  SEEK MEDICAL CARE IF:  You develop a fever.   The infection is getting worse after 2 days of treatment.   The infection is not getting better after 3 days of treatment.   You develop blisters in or around your vagina.   You develop vaginal bleeding, and it is not your menstrual period.   You have pain when you urinate.   You develop intestinal problems.   You have pain with sexual intercourse.  Document Released: 08/08/2004 Document Re-Released: 04/28/2009 [facility] Patient Information 2011 [place], [place].

## 2010-12-24 NOTE — Progress Notes (Signed)
Brittney Townsend [id number] Jul 16, 1970 12/24/2010      Progress Note-Follow Up  Subjective  Chief Complaint  Chief Complaint  Patient presents with  . Urinary Tract Infection    X 7 days    HPI  Patient is in today with continuing burning with urination and along the vaginal mucosa despite recent treatment for yeast and bacterial overgrowth, she does have some intermittent vaginal discharge but no odor or color is noted. No fevers/chills/GI c/o. No back or abdominal pain. No CP/palp/SOB. Has not tried any OTC meds, is trying to drink plenty of meds. No change in partners, she is active without any concerns. No anorexia/n/v/diarrhea.  Past Medical History  Diagnosis Date  . Allergy     seasonal  . Anxiety   . Migraines   . Overweight 09/28/2010  . Other and unspecified ovarian cyst 09/28/2010  . MIGRAINE HEADACHE 09/28/2010  . 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 HYPERTRIGLYCERIDEMIA 09/28/2010  . GERD 09/28/2010  . CHEST PAIN, ATYPICAL 09/28/2010  . ANXIETY DISORDER 09/28/2010  . Anxiety disorder 11/02/2010  . Knee pain, left 12/12/2010  . Sinusitis acute 12/12/2010  . Environmental allergies 12/12/2010  . Vaginitis and vulvovaginitis 12/24/2010    Past Surgical History  Procedure Date  . Abdominal hysterectomy 2008    total  . Back surgery 2009    L5 fracture required pins, rod, fusion L4-L5 fusion  . Elbow surgery 2011    carpal tunnel release on right  . Cesarean section 1998, 2003    Family History  Problem Relation Age of Onset  . Hypertension Mother   . Depression Mother   . Hyperlipidemia Father   . Diabetes Father     type 2  . Cancer Maternal Grandmother     Lung/ previous smoker  . Hypertension Maternal Grandmother   . Hyperlipidemia Maternal Grandmother   . Other Maternal Grandfather     renal failure  . Heart disease Maternal Grandfather   . Hypertension Maternal Grandfather   . Hyperlipidemia Maternal Grandfather   . Diabetes Paternal Grandmother   .  Hypertension Paternal Grandmother   . Hyperlipidemia Paternal Grandmother   . Hypertension Paternal Grandfather   . Hyperlipidemia Paternal Grandfather   . Cancer Paternal Grandfather     skin  . Depression Brother   . Obesity Son   . Heart attack Paternal Aunt     smoker  . Heart attack Paternal Uncle     smoker    History   Social History  . Marital Status: Married    Spouse Name: N/A    Number of Children: N/A  . Years of Education: N/A   Occupational History  . Not on file.   Social History Main Topics  . Smoking status: Never Smoker   . Smokeless tobacco: Never Used  . Alcohol Use: Yes     rare, special occasions  . Drug Use: No  . Sexually Active: Yes -- Female partner(s)   Other Topics Concern  . Not on file   Social History Narrative  . No narrative on file    Current Outpatient Prescriptions on File Prior to Visit  Medication Sig Dispense Refill  . ALPRAZolam (XANAX) 0.25 MG tablet Take 1 tablet (0.25 mg total) by mouth 2 (two) times daily as needed. 1/2 to 1 tab po bid prn  20 tablet  1  . atenolol (TENORMIN) 25 MG tablet Take 25 mg by mouth daily. 1/2 to 1 tab po daily as tolerated       .  Cetirizine HCl 10 MG CAPS Take 1 capsule (10 mg total) by mouth daily.  30 capsule  0  . clindamycin-benzoyl peroxide (BENZACLIN) gel Apply topically daily. Apply to lesions daily as tolerated       . dexlansoprazole (DEXILANT) 60 MG capsule Take 60 mg by mouth daily.        Marland Kitchen estrogens, conjugated, (PREMARIN) 1.25 MG tablet Take 1 tablet (1.25 mg total) by mouth daily.  30 tablet  3  . fluconazole (DIFLUCAN) 150 MG tablet Take 1 tablet (150 mg total) by mouth once a week.  2 tablet  1  . guaiFENesin (MUCINEX) 600 MG 12 hr tablet Take 1 tablet (600 mg total) by mouth 2 (two) times daily.  60 tablet  2  . metroNIDAZOLE (FLAGYL) 500 MG tablet Take 1 tablet (500 mg total) by mouth 2 (two) times daily. X 5 days  10 tablet  0  . naproxen (NAPROSYN) 375 MG tablet Take 1 tablet  (375 mg total) by mouth 2 (two) times daily as needed (pain, with food).  60 tablet  2  . pentosan polysulfate (ELMIRON) 100 MG capsule Take 100 mg by mouth 3 (three) times daily before meals.       . Probiotic Product (PRO-FLORA CONCENTRATE) CAPS Take 1 capsule by mouth daily.        . ciprofloxacin (CIPRO) 500 MG tablet Take 1 tablet (500 mg total) by mouth 2 (two) times daily.  28 tablet  0  . Meth-Hyo-M Bl-Na Phos-Ph Sal (PHOSPHASAL) 81.6 MG TABS Take by mouth daily.        . minocycline (MINOCIN,DYNACIN) 50 MG capsule Take 50 mg by mouth 2 (two) times daily.        . Saline 0.65 % (SOLN) SOLN Place 1 spray into the nose every 4 (four) hours as needed.  1 Bottle  0  . Soap & Cleansers (CETAPHIL GENTLE CLEANSING) BAR Apply topically. Wash daily affected       . SUMAtriptan (IMITREX) 100 MG tablet Take 100 mg by mouth every 2 (two) hours as needed.        . traZODone (DESYREL) 50 MG tablet Take 50 mg by mouth at bedtime.        Marland Kitchen DISCONTD: escitalopram (LEXAPRO) 10 MG tablet Take 10 mg by mouth daily. 1/2 tab po daily X 7 days then quit          Allergies  Allergen Reactions  . Sulfonamide Derivatives     REACTION: hives    Review of Systems  Review of Systems  Constitutional: Negative for fever and malaise/fatigue.  HENT: Negative for congestion.   Eyes: Negative for discharge.  Respiratory: Negative for shortness of breath.   Cardiovascular: Negative for chest pain, palpitations and leg swelling.  Gastrointestinal: Negative for nausea, abdominal pain and diarrhea.  Genitourinary: Positive for dysuria. Negative for urgency and frequency.       Vaginal discharge, some itching and burning worse in evening all noted over last couple of weeks  Musculoskeletal: Negative for falls.  Skin: Negative for rash.  Neurological: Negative for loss of consciousness and headaches.  Endo/Heme/Allergies: Negative for polydipsia.  Psychiatric/Behavioral: Negative for depression and suicidal ideas.  The patient is not nervous/anxious and does not have insomnia.     Objective  BP 136/78  Pulse 78  Temp(Src) 97.9 F (36.6 C) (Oral)  Ht 5\' 3"  (1.6 m)  Wt 165 lb 6.4 oz (75.025 kg)  BMI 29.30 kg/m2  SpO2 98%  Physical Exam  Physical Exam  Constitutional: She is oriented to person, place, and time and well-developed, well-nourished, and in no distress. No distress.  HENT:  Head: Normocephalic and atraumatic.  Eyes: Conjunctivae are normal.  Neck: Neck supple. No thyromegaly present.  Cardiovascular: Normal rate, regular rhythm and normal heart sounds.   No murmur heard. Pulmonary/Chest: Effort normal and breath sounds normal. She has no wheezes.  Abdominal: She exhibits no distension and no mass.  Genitourinary: Vaginal discharge found.       Vulvovaginal mucosa erythematous and edematous. No discrete lesions, vaginal discharge is white and thin. No odor. Cervix/Uterus surgically absent  Musculoskeletal: She exhibits no edema.  Lymphadenopathy:    She has no cervical adenopathy.  Neurological: She is alert and oriented to person, place, and time.  Skin: Skin is warm and dry. No rash noted. She is not diaphoretic.  Psychiatric: Memory, affect and judgment normal.    Lab Results  Component Value Date   TSH 0.71 10/29/2010   Lab Results  Component Value Date   WBC 6.9 10/29/2010   HGB 13.1 10/29/2010   HCT 38.1 10/29/2010   MCV 92.2 10/29/2010   PLT 204.0 10/29/2010   Lab Results  Component Value Date   CREATININE 0.8 10/29/2010   BUN 15 10/29/2010   NA 140 10/29/2010   K 4.4 10/29/2010   CL 105 10/29/2010   CO2 27 10/29/2010   Lab Results  Component Value Date   ALT 16 10/29/2010   AST 18 10/29/2010   ALKPHOS 37* 10/29/2010   BILITOT 0.3 10/29/2010   Lab Results  Component Value Date   CHOL 222* 10/29/2010   Lab Results  Component Value Date   HDL 53.20 10/29/2010   No results found for this basename: [facility]   Lab Results  Component Value Date   TRIG 233.0*  10/29/2010   Lab Results  Component Value Date   CHOLHDL 4 10/29/2010     Assessment & Plan  Vaginitis and vulvovaginitis Patient continues to struggle despite recent treatment for candida and bacterial vaginosis, she has almost finished her course of treatment of Flagyl and her discharge and vaginal irritation burning is persistent. Notes burning and some itching. Exam consistent with possible candida and/or bacterial overgrowth, take a Diflucan today, finish course of Flagyl then take a second Diflucan. Testing done today await results. Encouraged no soap to fragile mucosa, shower with hot water bid and blow dry, take a probiotic and yogurt daily and reevaluate if symptoms persist.  GERD Some increased burning today will check urine dip  INTERSTITIAL CYSTITIS Urine dip unremarkable, push clear fluids, report a change in symptoms

## 2010-12-24 NOTE — Assessment & Plan Note (Signed)
Patient continues to struggle despite recent treatment for candida and bacterial vaginosis, she has almost finished her course of treatment of Flagyl and her discharge and vaginal irritation burning is persistent. Notes burning and some itching. Exam consistent with possible candida and/or bacterial overgrowth, take a Diflucan today, finish course of Flagyl then take a second Diflucan. Testing done today await results. Encouraged no soap to fragile mucosa, shower with hot water bid and blow dry, take a probiotic and yogurt daily and reevaluate if symptoms persist.

## 2010-12-24 NOTE — Assessment & Plan Note (Signed)
Some increased burning today will check urine dip

## 2010-12-25 NOTE — Assessment & Plan Note (Signed)
Urine dip unremarkable, push clear fluids, report a change in symptoms

## 2010-12-28 ENCOUNTER — Other Ambulatory Visit: Payer: Self-pay | Admitting: *Deleted

## 2010-12-28 DIAGNOSIS — F411 Generalized anxiety disorder: Secondary | ICD-10-CM

## 2010-12-28 MED ORDER — ALPRAZOLAM 0.25 MG PO TABS: ORAL_TABLET | ORAL | Status: DC

## 2010-12-28 NOTE — Telephone Encounter (Signed)
Pt requests refill on xanax.  Last refilled on 10/29/10 #20 1 rf.  Pt is due for refill.

## 2010-12-28 NOTE — Telephone Encounter (Signed)
OK to refill with same number and same sig, 1 rf

## 2011-02-08 ENCOUNTER — Ambulatory Visit (INDEPENDENT_AMBULATORY_CARE_PROVIDER_SITE_OTHER): Payer: BC Managed Care – PPO | Admitting: Family Medicine

## 2011-02-08 ENCOUNTER — Encounter: Payer: Self-pay | Admitting: Family Medicine

## 2011-02-08 VITALS — BP 139/94 | HR 75 | Temp 97.5°F | Ht 63.0 in | Wt 165.0 lb

## 2011-02-08 DIAGNOSIS — N6009 Solitary cyst of unspecified breast: Secondary | ICD-10-CM

## 2011-02-08 NOTE — Progress Notes (Addendum)
OFFICE NOTE  02/08/2011  CC:  Chief Complaint  Patient presents with  . Cyst    right breast     HPI:   Patient is a 40 y.o. Caucasian female who is here for c/o "breast cyst". Feels an irritating area on lower right portion of right breast over the last 1 yr and says it is gradually feeling larger.  No nipple d/c. This was dx'd as a cyst in the past and she has tried the warm compresses that were recommended but she's getting no benefit.  She asks that the area be drained or removed today.   No fatigue or malaise.  No weight loss.  Pertinent PMH:  Question of breast mass 11/2010--mammo and u/s NORMAL at that time, plus radiologist commented that he palpated no mass in either breast. TAH and BSO--for nonmalignant reason.  MEDS;   Outpatient Prescriptions Prior to Visit  Medication Sig Dispense Refill  . ALPRAZolam (XANAX) 0.25 MG tablet 1/2 to 1 tablet by mouth twice daily as needed.  20 tablet  1  . atenolol (TENORMIN) 25 MG tablet Take 25 mg by mouth daily. 1/2 to 1 tab po daily as tolerated       . Cetirizine HCl 10 MG CAPS Take 1 capsule (10 mg total) by mouth daily.  30 capsule  0  . clindamycin-benzoyl peroxide (BENZACLIN) gel Apply topically daily. Apply to lesions daily as tolerated       . dexlansoprazole (DEXILANT) 60 MG capsule Take 60 mg by mouth daily.        Marland Kitchen estrogens, conjugated, (PREMARIN) 1.25 MG tablet Take 1 tablet (1.25 mg total) by mouth daily.  30 tablet  3  . guaiFENesin (MUCINEX) 600 MG 12 hr tablet Take 1 tablet (600 mg total) by mouth 2 (two) times daily.  60 tablet  2  . Meth-Hyo-M Bl-Na Phos-Ph Sal (PHOSPHASAL) 81.6 MG TABS Take by mouth daily.        . naproxen (NAPROSYN) 375 MG tablet Take 1 tablet (375 mg total) by mouth 2 (two) times daily as needed (pain, with food).  60 tablet  2  . pentosan polysulfate (ELMIRON) 100 MG capsule Take 100 mg by mouth 3 (three) times daily before meals.       . Probiotic Product (PRO-FLORA CONCENTRATE) CAPS Take 1  capsule by mouth daily.        . Saline 0.65 % (SOLN) SOLN Place 1 spray into the nose every 4 (four) hours as needed.  1 Bottle  0  . Soap & Cleansers (CETAPHIL GENTLE CLEANSING) BAR Apply topically. Wash daily affected       . SUMAtriptan (IMITREX) 100 MG tablet Take 100 mg by mouth every 2 (two) hours as needed.        . fluconazole (DIFLUCAN) 150 MG tablet 1 tab po weekly x 2  2 tablet  1  . metroNIDAZOLE (FLAGYL) 500 MG tablet Take 1 tablet (500 mg total) by mouth 2 (two) times daily. X 5 days  10 tablet  0  . minocycline (MINOCIN,DYNACIN) 50 MG capsule Take 50 mg by mouth 2 (two) times daily.        . traZODone (DESYREL) 50 MG tablet Take 50 mg by mouth at bedtime.          PE: Blood pressure 139/94, pulse 75, temperature 97.5 F (36.4 C), temperature source Oral, height 5\' 3"  (1.6 m), weight 165 lb (74.844 kg). Gen: Alert, well appearing.  Patient is oriented to person, place,  time, and situation. Breasts are symmetric.  No dimpling.  The area at 7 oclock position of right breast has some mildly firm/indurated-feeling tissue that does not feel like a distinct mass, nor is there any fluctuance to suggest loculated cystic fluid.  This area is nontender and the overlying skin is normal.  Both breasts have mild diffuse lumpiness texture c/w fibrocystic breast tissue.  No dominant, discrete, fixed  or suspicious masses are noted.  No skin or nipple changes.   IMPRESSION AND PLAN:  Breast cyst She has fibrocystic breasts without any distinct mass or cyst that I can detect.  However, she certainly feels something and is concerned that it is growing.  I told her that there was no drainage or excision indicated from my perspective, but I offered referral to general surgeon for another opinion and possibly bedside u/s of her area of concern.  She did want this so I referred her to [facility] [place] [facility]. It is reassuring that she has had negative breast imaging just a little over a year  ago.     FOLLOW UP:  Return if symptoms worsen or fail to improve.

## 2011-02-08 NOTE — Assessment & Plan Note (Signed)
She has fibrocystic breasts without any distinct mass or cyst that I can detect.  However, she certainly feels something and is concerned that it is growing.  I told her that there was no drainage or excision indicated from my perspective, but I offered referral to general surgeon for another opinion and possibly bedside u/s of her area of concern.  She did want this so I referred her to [facility] [place] [facility]. It is reassuring that she has had negative breast imaging just a little over a year ago.

## 2011-02-11 ENCOUNTER — Ambulatory Visit (INDEPENDENT_AMBULATORY_CARE_PROVIDER_SITE_OTHER): Payer: BC Managed Care – PPO | Admitting: General Surgery

## 2011-02-13 ENCOUNTER — Other Ambulatory Visit: Payer: Self-pay

## 2011-02-13 DIAGNOSIS — F411 Generalized anxiety disorder: Secondary | ICD-10-CM

## 2011-02-13 MED ORDER — ALPRAZOLAM 0.25 MG PO TABS: ORAL_TABLET | ORAL | Status: DC

## 2011-02-15 ENCOUNTER — Telehealth: Payer: Self-pay | Admitting: Family Medicine

## 2011-02-15 MED ORDER — DEXLANSOPRAZOLE 60 MG PO CPDR: 60.0000 mg | DELAYED_RELEASE_CAPSULE | Freq: Every day | ORAL | Status: DC

## 2011-02-15 NOTE — Telephone Encounter (Signed)
Patient request Dexilant 60 mg refill sent to [facility]

## 2011-02-15 NOTE — Telephone Encounter (Signed)
OK to refill Dexilant 60mg  po daily, # 630, 5 rf

## 2011-02-28 ENCOUNTER — Encounter (INDEPENDENT_AMBULATORY_CARE_PROVIDER_SITE_OTHER): Payer: Self-pay | Admitting: General Surgery

## 2011-02-28 ENCOUNTER — Ambulatory Visit (INDEPENDENT_AMBULATORY_CARE_PROVIDER_SITE_OTHER): Payer: BC Managed Care – PPO | Admitting: General Surgery

## 2011-02-28 VITALS — BP 118/78 | HR 64 | Temp 97.6°F | Ht 63.0 in | Wt 162.2 lb

## 2011-02-28 DIAGNOSIS — N63 Unspecified lump in unspecified breast: Secondary | ICD-10-CM

## 2011-02-28 DIAGNOSIS — N6310 Unspecified lump in the right breast, unspecified quadrant: Secondary | ICD-10-CM

## 2011-02-28 NOTE — Patient Instructions (Signed)
The mass in your right breast in the lower outer quadrant appears to be a skin nodule, cyst, or lipoma. We will schedule excision of this mass under local anesthesia in the near future.

## 2011-02-28 NOTE — Progress Notes (Signed)
Subjective:     Patient ID: Brittney [place], female   DOB: May 17, 1971, 40 y.o.   MRN: [id number]  HPI This is a pleasant 40 year old Caucasian female, centimeter the courtesy of Dr. Nicoletta Ba. Her primary physician is Dr. Abner Greenspan. Her gynecologist is Dr. Sherron Monday.  The patient was concerned one year ago that she had a lump in the left breast and in the right breast. On Nov 30, 2009 she had bilateral mammograms and bilateral ultrasounds. The radiologist felt no palpable mass and found no abnormality in any of the imaging studies. They advised followup mammograms at age 40.  She recently noted a firm nodule in the right breast in the lower outer quadrant. There's been no pain no trauma and no nipple discharge. She wants this area excised because it is visible.  Past Medical History  Diagnosis Date  . Allergy     seasonal  . Anxiety   . Migraines   . Overweight 09/28/2010  . Other and unspecified ovarian cyst 09/28/2010  . MIGRAINE HEADACHE 09/28/2010  . 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 HYPERTRIGLYCERIDEMIA 09/28/2010  . GERD 09/28/2010  . CHEST PAIN, ATYPICAL 09/28/2010  . ANXIETY DISORDER 09/28/2010  . Anxiety disorder 11/02/2010  . Knee pain, left 12/12/2010  . Sinusitis acute 12/12/2010  . Environmental allergies 12/12/2010  . Vaginitis and vulvovaginitis 12/24/2010   Current Outpatient Prescriptions  Medication Sig Dispense Refill  . dexlansoprazole (DEXILANT) 60 MG capsule Take 1 capsule (60 mg total) by mouth daily.  30 capsule  5  . estrogens, conjugated, (PREMARIN) 1.25 MG tablet Take 1 tablet (1.25 mg total) by mouth daily.  30 tablet  3  . ALPRAZolam (XANAX) 0.25 MG tablet 1/2 to 1 tablet by mouth twice daily as needed.  20 tablet  1  . atenolol (TENORMIN) 25 MG tablet Take 25 mg by mouth daily. 1/2 to 1 tab po daily as tolerated       . Cetirizine HCl 10 MG CAPS Take 1 capsule (10 mg total) by mouth daily.  30 capsule  0  . clindamycin-benzoyl peroxide (BENZACLIN) gel Apply  topically daily. Apply to lesions daily as tolerated       . guaiFENesin (MUCINEX) 600 MG 12 hr tablet Take 1 tablet (600 mg total) by mouth 2 (two) times daily.  60 tablet  2  . Meth-Hyo-M Bl-Na Phos-Ph Sal (PHOSPHASAL) 81.6 MG TABS Take by mouth daily.        . naproxen (NAPROSYN) 375 MG tablet Take 1 tablet (375 mg total) by mouth 2 (two) times daily as needed (pain, with food).  60 tablet  2  . pentosan polysulfate (ELMIRON) 100 MG capsule Take 100 mg by mouth 3 (three) times daily before meals.       . Probiotic Product (PRO-FLORA CONCENTRATE) CAPS Take 1 capsule by mouth daily.        . Saline 0.65 % (SOLN) SOLN Place 1 spray into the nose every 4 (four) hours as needed.  1 Bottle  0  . Soap & Cleansers (CETAPHIL GENTLE CLEANSING) BAR Apply topically. Wash daily affected       . SUMAtriptan (IMITREX) 100 MG tablet Take 100 mg by mouth every 2 (two) hours as needed.         Allergies  Allergen Reactions  . Sulfonamide Derivatives     REACTION: hives    Family History  Problem Relation Age of Onset  . Hypertension Mother   . Depression Mother   . Hyperlipidemia Father   .  Diabetes Father     type 2  . Cancer Maternal Grandmother     Lung/ previous smoker  . Hypertension Maternal Grandmother   . Hyperlipidemia Maternal Grandmother   . Other Maternal Grandfather     renal failure  . Heart disease Maternal Grandfather   . Hypertension Maternal Grandfather   . Hyperlipidemia Maternal Grandfather   . Diabetes Paternal Grandmother   . Hypertension Paternal Grandmother   . Hyperlipidemia Paternal Grandmother   . Hypertension Paternal Grandfather   . Hyperlipidemia Paternal Grandfather   . Cancer Paternal Grandfather     skin  . Depression Brother   . Obesity Son   . Heart attack Paternal Aunt     smoker  . Heart attack Paternal Uncle     smoker    History  Substance Use Topics  . Smoking status: Never Smoker   . Smokeless tobacco: Never Used  . Alcohol Use: Yes      rare, special occasions     Review of Systems  Constitutional: Negative.   HENT: Negative.   Eyes: Negative.   Respiratory: Negative.   Cardiovascular: Negative.   Gastrointestinal: Negative.   Genitourinary: Negative.   Neurological: Negative.   Hematological: Negative.   Psychiatric/Behavioral: Negative.        Objective:   Physical Exam  Constitutional: She is oriented to person, place, and time. She appears well-developed and well-nourished. No distress.  HENT:  Head: Normocephalic and atraumatic.  Nose: Nose normal.  Mouth/Throat: No oropharyngeal exudate.  Eyes: Conjunctivae and EOM are normal. Pupils are equal, round, and reactive to light. Left eye exhibits no discharge. No scleral icterus.  Neck: Neck supple. No JVD present. No tracheal deviation present. No thyromegaly present.  Cardiovascular: Normal rate, regular rhythm, normal heart sounds and intact distal pulses.   No murmur heard. Pulmonary/Chest: Effort normal and breath sounds normal. No respiratory distress. She has no wheezes. She has no rales. She exhibits no tenderness.    Abdominal: Soft. Bowel sounds are normal. She exhibits no distension and no mass. There is no tenderness. There is no rebound and no guarding.    Musculoskeletal: She exhibits no edema and no tenderness.  Lymphadenopathy:    She has no cervical adenopathy.  Neurological: She is alert and oriented to person, place, and time. She exhibits normal muscle tone. Coordination normal.  Skin: Skin is warm. No rash noted. She is not diaphoretic. No erythema. No pallor.  Psychiatric: She has a normal mood and affect. Her behavior is normal. Judgment and thought content normal.       Assessment:     7 mm cystic mass right breast, 7:30 position. This appears to be a dermal cystic process and is likely benign.  Status post cesarean section x2.  Status post hysterectomy and nephrectomy, on estrogen replacement.  Interstitial  cystitis.  Chronic anxiety.    Plan:     I discussed the differential diagnosis with the patient. i told her that this is most likely a dermal cyst ,could be a lipoma or some other type of fibrous tumor. It is unlikely to be a malignancy.  She would like this area excised, and so we are going to schedule her for excision of this area under local anesthesia in the near future.  I discussed the indications and details of surgery with her. Risks and complications have been outlined, including but not limited to bleeding, infection, cosmetic deformity, nerve damage with chronic pain or numbness, and other unforseen  problems.  She understands that we will send this for pathologic exam. She seems to understand all of these issues well. At this time all of her questions were answered.

## 2011-04-08 ENCOUNTER — Encounter (INDEPENDENT_AMBULATORY_CARE_PROVIDER_SITE_OTHER): Payer: BC Managed Care – PPO | Admitting: General Surgery

## 2011-04-11 ENCOUNTER — Other Ambulatory Visit: Payer: Self-pay | Admitting: Family Medicine

## 2011-04-12 ENCOUNTER — Other Ambulatory Visit: Payer: Self-pay | Admitting: Family Medicine

## 2011-04-15 ENCOUNTER — Ambulatory Visit (HOSPITAL_BASED_OUTPATIENT_CLINIC_OR_DEPARTMENT_OTHER)
Admission: RE | Admit: 2011-04-15 | Discharge: 2011-04-15 | Disposition: A | Discharge status: Home / Self Care | Payer: BC Managed Care – PPO | Source: Ambulatory Visit | Attending: Family Medicine | Admitting: Family Medicine

## 2011-04-15 ENCOUNTER — Encounter: Payer: Self-pay | Admitting: Family Medicine

## 2011-04-15 ENCOUNTER — Other Ambulatory Visit (HOSPITAL_BASED_OUTPATIENT_CLINIC_OR_DEPARTMENT_OTHER): Payer: BC Managed Care – PPO

## 2011-04-15 ENCOUNTER — Ambulatory Visit (INDEPENDENT_AMBULATORY_CARE_PROVIDER_SITE_OTHER): Payer: BC Managed Care – PPO | Admitting: Family Medicine

## 2011-04-15 DIAGNOSIS — R110 Nausea: Secondary | ICD-10-CM | POA: Insufficient documentation

## 2011-04-15 DIAGNOSIS — R109 Unspecified abdominal pain: Secondary | ICD-10-CM

## 2011-04-15 DIAGNOSIS — R1013 Epigastric pain: Secondary | ICD-10-CM | POA: Insufficient documentation

## 2011-04-15 LAB — CBC WITH DIFFERENTIAL/PLATELET
Basophils Absolute: 0 10*3/uL (ref 0.0–0.1)
Basophils Relative: 0.4 % (ref 0.0–3.0)
Eosinophils Absolute: 0.1 10*3/uL (ref 0.0–0.7)
Lymphocytes Relative: 33.1 % (ref 12.0–46.0)
MCHC: 34.1 g/dL (ref 30.0–36.0)
MCV: 91.7 fl (ref 78.0–100.0)
Monocytes Absolute: 0.3 10*3/uL (ref 0.1–1.0)
Neutrophils Relative %: 60.8 % (ref 43.0–77.0)
Platelets: 221 10*3/uL (ref 150.0–400.0)
RDW: 12.2 % (ref 11.5–14.6)

## 2011-04-15 LAB — HEPATIC FUNCTION PANEL
Albumin: 4.1 g/dL (ref 3.5–5.2)
Bilirubin, Direct: 0 mg/dL (ref 0.0–0.3)
Total Protein: 7.5 g/dL (ref 6.0–8.3)

## 2011-04-15 LAB — RENAL FUNCTION PANEL
Albumin: 4.1 g/dL (ref 3.5–5.2)
GFR: 92.49 mL/min (ref >60.00)
Glucose, Bld: 99 mg/dL (ref 70–99)
Phosphorus: 2.7 mg/dL (ref 2.3–4.6)
Potassium: 4.4 mEq/L (ref 3.5–5.1)
Sodium: 139 mEq/L (ref 135–145)

## 2011-04-15 LAB — SEDIMENTATION RATE: Sed Rate: 16 mm/hr (ref 0–22)

## 2011-04-15 MED ORDER — PROMETHAZINE HCL 25 MG PO TABS: 25.0000 mg | ORAL_TABLET | Freq: Three times a day (TID) | ORAL | Status: DC | PRN

## 2011-04-15 MED ORDER — TRAMADOL HCL 50 MG PO TABS: 50.0000 mg | ORAL_TABLET | Freq: Three times a day (TID) | ORAL | Status: AC | PRN

## 2011-04-15 NOTE — Patient Instructions (Addendum)
Gastritis Gastritis is an irritation of the stomach. This is often caused by medications, but can be from anything that bothers the stomach.  Other stomach irritants are:  Alcohol.  Caffeine.   Nicotine.   Spicy or acid foods.   Medications for pain and arthritis. Aspirin and other anti-inflammatory medicines such as ibuprofen (Advil), naproxen (Aleve), and ketoprofen (Orudis) can be highly irritating.  Emotional distress.   Symptoms of gastritis may include:  Abdominal pain.  Indigestion.   Nausea and or vomiting.  Bleeding.   Some patients with chronic gastritis and ulcers have been infected by a germ. They may need special testing. Medications which kill germs can be used to cure this condition. Treatment includes avoiding the substances mentioned above that are known to cause stomach trouble. Medications used to treat gastritis can include:  Antacids.  Medicines to control vomiting.   Acid blocking medicines.   Symptoms of gastritis usually improve within 2-3 days of starting treatment. Call your caregiver if you are not better in a few days. SEEK MEDICAL CARE IF YOU:   Have increased stomach or chest pain.  Vomit blood.   Faint or feel light headed.   Can not keep fluids down.  Pass bloody or black stools.   Develop severe back pain.   MAKE SURE YOU:   Understand these instructions.   Will watch your condition.   Will get help right away if you are not doing well or get worse.  Document Released: 07/01/2005 Document Re-Released: 09/27/2008 [facility] Patient Information 2011 [place], [place].Gallbladder Disease You have gallbladder disease. This means that there are stones and/or inflammation in your gallbladder and bile duct system. The symptoms of this disease are: heartburn, nausea, belching, vomiting, and intolerance to certain foods (fatty foods mainly). Exact diagnosis of this condition requires ultrasound or special x-ray examination. Gallbladder symptoms  may improve with a proper low-fat diet and weight loss (if you are overweight). Medicines to relieve pain and reduce spasms in the bile duct may be quite helpful. Usually the diseased gallbladder needs to be removed by surgery.  SEEK IMMEDIATE MEDICAL CARE IF YOU HAVE:  More severe or persistent pain that lasts for more than one day. The pain would most likely be in the upper right part of the abdomen.   Fever, repeated vomiting, or jaundice (yellow skin and eyes).  Document Released: 08/08/2004 Document Re-Released: 06/19/2009 [facility] Patient Information 2011 [place], [place].    Clear liquid diet for the next 24 hours, try water, ginger ale, gatorade but avoid all caffeine. If H Pylori and Gallbladder disease are negative then you likely have a stomach bug so as tolerated can advance to [employer] ( Bananas, Rice, Applesauce, Toast)   Avoid all NSAIDS (ie Ibuprofen and Naproxen) this week, for mild pain use Tylenol/Acetaminophen as needed 500mg  tabs 2 tabs up to 4 x daily, no more  If pain severe will give a few Tramadol to try as needed  If stomach feels tender over next week as you recover, may use OTC Zantac/Ranitidine 150mg  tabs 1 tab twice daily to suppress acid production this month  Start a probiotic such as Align caps daily for next month to help stomach heal and eat a yogurt daily as well once the appetite returns  Do not eat between now and the Ultrasound scheduled at 2:30 this afternoon

## 2011-04-15 NOTE — Assessment & Plan Note (Addendum)
Patient has had abdominal pain worst in the epigastrium and struggling with nausea and sour taste in mouth, has noted some RUQ pain but RUQ [place] is negative, she will treat this like a gastroenteritis with a clear liquid diet and progressing to [employer] as tolerated, she will notify us if symptoms worsen or seek immediate medical care if symptoms worsen

## 2011-04-22 ENCOUNTER — Other Ambulatory Visit: Payer: Self-pay

## 2011-04-22 DIAGNOSIS — F411 Generalized anxiety disorder: Secondary | ICD-10-CM

## 2011-04-22 MED ORDER — ALPRAZOLAM 0.25 MG PO TABS: ORAL_TABLET | ORAL | Status: DC

## 2011-04-22 NOTE — Telephone Encounter (Signed)
Fax to [phone]

## 2011-04-22 NOTE — Telephone Encounter (Signed)
faxed

## 2011-04-23 ENCOUNTER — Encounter: Payer: Self-pay | Admitting: Family Medicine

## 2011-04-23 NOTE — Progress Notes (Signed)
Brittney Townsend [id number] 1970-11-19 04/23/2011      Progress Note-Follow Up  Subjective  Chief Complaint  Chief Complaint  Patient presents with  . Abdominal Pain    X 2 day- middle of stomach  . Gas    X 2 days- belching and passing gas  . Nausea    X 1 day  . sour taste in mouth    X 2 days    HPI  Patient is a 40 year old Caucasian female in today with a 24-hour history of abdominal pain. Pain is most notable in the epigastrium but also occurs in the right and left upper quadrants. She describes heavy gaseousness, burping, nausea and this irritated her mouth as well she is concerned about gallbladder attack as she has had several family members with the same situation. No fevers, chills nobody in her home has similar symptoms as. No chest pain, palpitations, shortness of breath. No change in bowels, bloody or tarry stool no urinary complaints at this time.  Past Medical History  Diagnosis Date  . Allergy     seasonal  . Anxiety   . Migraines   . Overweight 09/28/2010  . Other and unspecified ovarian cyst 09/28/2010  . MIGRAINE HEADACHE 09/28/2010  . 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 HYPERTRIGLYCERIDEMIA 09/28/2010  . GERD 09/28/2010  . CHEST PAIN, ATYPICAL 09/28/2010  . ANXIETY DISORDER 09/28/2010  . Anxiety disorder 11/02/2010  . Knee pain, left 12/12/2010  . Sinusitis acute 12/12/2010  . Environmental allergies 12/12/2010  . Vaginitis and vulvovaginitis 12/24/2010  . Acute epigastric pain 04/15/2011    Past Surgical History  Procedure Date  . Abdominal hysterectomy 2008    total  . Back surgery 2009    L5 fracture required pins, rod, fusion L4-L5 fusion  . Elbow surgery 2011    carpal tunnel release on right  . Cesarean section 1998, 2003    Family History  Problem Relation Age of Onset  . Hypertension Mother   . Depression Mother   . Hyperlipidemia Father   . Diabetes Father     type 2  . Cancer Maternal Grandmother     Lung/ previous smoker  . Hypertension  Maternal Grandmother   . Hyperlipidemia Maternal Grandmother   . Other Maternal Grandfather     renal failure  . Heart disease Maternal Grandfather   . Hypertension Maternal Grandfather   . Hyperlipidemia Maternal Grandfather   . Diabetes Paternal Grandmother   . Hypertension Paternal Grandmother   . Hyperlipidemia Paternal Grandmother   . Hypertension Paternal Grandfather   . Hyperlipidemia Paternal Grandfather   . Cancer Paternal Grandfather     skin  . Depression Brother   . Obesity Son   . Heart attack Paternal Aunt     smoker  . Heart attack Paternal Uncle     smoker    History   Social History  . Marital Status: Married    Spouse Name: N/A    Number of Children: N/A  . Years of Education: N/A   Occupational History  . Not on file.   Social History Main Topics  . Smoking status: Never Smoker   . Smokeless tobacco: Never Used  . Alcohol Use: Yes     rare, special occasions  . Drug Use: No  . Sexually Active: Yes -- Female partner(s)   Other Topics Concern  . Not on file   Social History Narrative  . No narrative on file    Current Outpatient Prescriptions on File Prior  to Visit  Medication Sig Dispense Refill  . atenolol (TENORMIN) 25 MG tablet Take 25 mg by mouth daily. 1/2 to 1 tab po daily as tolerated       . Cetirizine HCl 10 MG CAPS Take 1 capsule (10 mg total) by mouth daily.  30 capsule  0  . clindamycin-benzoyl peroxide (BENZACLIN) gel Apply topically daily. Apply to lesions daily as tolerated       . dexlansoprazole (DEXILANT) 60 MG capsule Take 1 capsule (60 mg total) by mouth daily.  30 capsule  5  . guaiFENesin (MUCINEX) 600 MG 12 hr tablet Take 1 tablet (600 mg total) by mouth 2 (two) times daily.  60 tablet  2  . Meth-Hyo-M Bl-Na Phos-Ph Sal (PHOSPHASAL) 81.6 MG TABS Take by mouth daily.        . naproxen (NAPROSYN) 375 MG tablet Take 1 tablet (375 mg total) by mouth 2 (two) times daily as needed (pain, with food).  60 tablet  2  . pentosan  polysulfate (ELMIRON) 100 MG capsule Take 100 mg by mouth 3 (three) times daily before meals.       Marland Kitchen PREMARIN 1.25 MG tablet TAKE 1 TABLET (1.25 MG TOTAL) BY MOUTH DAILY.  30 tablet  3  . Probiotic Product (PRO-FLORA CONCENTRATE) CAPS Take 1 capsule by mouth daily.        Enid Baas & Cleansers (CETAPHIL GENTLE CLEANSING) BAR Apply topically. Wash daily affected       . SUMAtriptan (IMITREX) 100 MG tablet Take 100 mg by mouth every 2 (two) hours as needed.          Allergies  Allergen Reactions  . Sulfonamide Derivatives     REACTION: hives    Review of Systems  Review of Systems  Constitutional: Positive for malaise/fatigue. Negative for fever and chills.  HENT: Positive for sore throat. Negative for congestion.   Eyes: Positive for pain. Negative for discharge.  Respiratory: Negative for shortness of breath.   Cardiovascular: Negative for chest pain, palpitations and leg swelling.  Gastrointestinal: Positive for heartburn, nausea and abdominal pain. Negative for diarrhea, constipation, blood in stool and melena.  Genitourinary: Negative for dysuria.  Musculoskeletal: Negative for falls.  Skin: Negative for rash.  Neurological: Negative for loss of consciousness and headaches.  Endo/Heme/Allergies: Negative for polydipsia.  Psychiatric/Behavioral: Negative for depression and suicidal ideas. The patient is not nervous/anxious and does not have insomnia.     Objective  BP 118/88  Pulse 84  Temp(Src) 97.7 F (36.5 C) (Oral)  Ht 5\' 3"  (1.6 m)  Wt 167 lb 1.9 oz (75.805 kg)  BMI 29.60 kg/m2  SpO2 98%  Physical Exam  Physical Exam  Constitutional: She is oriented to person, place, and time and well-developed, well-nourished, and in no distress. No distress.  HENT:  Head: Normocephalic and atraumatic.  Eyes: Conjunctivae are normal.  Neck: Neck supple. No thyromegaly present.  Cardiovascular: Normal rate, regular rhythm and normal heart sounds.   No murmur  heard. Pulmonary/Chest: Effort normal and breath sounds normal. She has no wheezes.  Abdominal: Soft. Bowel sounds are normal. She exhibits no distension and no mass. There is tenderness.       Epigastric and upper quadrant pain with palpation  Musculoskeletal: She exhibits no edema.  Lymphadenopathy:    She has no cervical adenopathy.  Neurological: She is alert and oriented to person, place, and time.  Skin: Skin is warm and dry. No rash noted. She is not diaphoretic.  Psychiatric: Memory,  affect and judgment normal.    Lab Results  Component Value Date   TSH 0.71 10/29/2010   Lab Results  Component Value Date   WBC 6.6 04/15/2011   HGB 13.3 04/15/2011   HCT 39.1 04/15/2011   MCV 91.7 04/15/2011   PLT 221.0 04/15/2011   Lab Results  Component Value Date   CREATININE 0.7 04/15/2011   BUN 13 04/15/2011   NA 139 04/15/2011   K 4.4 04/15/2011   CL 104 04/15/2011   CO2 25 04/15/2011   Lab Results  Component Value Date   ALT 14 04/15/2011   AST 16 04/15/2011   ALKPHOS 42 04/15/2011   BILITOT 0.3 04/15/2011   Lab Results  Component Value Date   CHOL 222* 10/29/2010   Lab Results  Component Value Date   HDL 53.20 10/29/2010   No results found for this basename: LDLCALC   Lab Results  Component Value Date   TRIG 233.0* 10/29/2010   Lab Results  Component Value Date   CHOLHDL 4 10/29/2010     Assessment & Plan  Acute epigastric pain Patient has had abdominal pain worst in the epigastrium and struggling with nausea and sour taste in mouth, has noted some RUQ pain but RUQ [place] is negative, she will treat this like a gastroenteritis with a clear liquid diet and progressing to [employer] as tolerated, she will notify us if symptoms worsen or seek immediate medical care if symptoms worsen

## 2011-04-26 LAB — CBC
HCT: 36.4
Hemoglobin: 12.8
MCV: 86.4
Platelets: 255
RBC: 4.21
WBC: 7.2

## 2011-04-26 LAB — DIFFERENTIAL
Eosinophils Absolute: 0.1
Eosinophils Relative: 1
Lymphocytes Relative: 23
Lymphs Abs: 1.7
Monocytes Absolute: 0.3
Monocytes Relative: 4

## 2011-05-01 ENCOUNTER — Ambulatory Visit (INDEPENDENT_AMBULATORY_CARE_PROVIDER_SITE_OTHER): Payer: BC Managed Care – PPO | Admitting: Family Medicine

## 2011-05-01 ENCOUNTER — Encounter: Payer: Self-pay | Admitting: Family Medicine

## 2011-05-01 VITALS — BP 139/87 | HR 79 | Temp 98.1°F | Ht 63.0 in | Wt 167.8 lb

## 2011-05-01 DIAGNOSIS — R5381 Other malaise: Secondary | ICD-10-CM

## 2011-05-01 DIAGNOSIS — N3010 Interstitial cystitis (chronic) without hematuria: Secondary | ICD-10-CM

## 2011-05-01 DIAGNOSIS — E785 Hyperlipidemia, unspecified: Secondary | ICD-10-CM

## 2011-05-01 DIAGNOSIS — K219 Gastro-esophageal reflux disease without esophagitis: Secondary | ICD-10-CM

## 2011-05-01 DIAGNOSIS — Z0000 Encounter for general adult medical examination without abnormal findings: Secondary | ICD-10-CM

## 2011-05-01 DIAGNOSIS — R5383 Other fatigue: Secondary | ICD-10-CM

## 2011-05-01 DIAGNOSIS — Z23 Encounter for immunization: Secondary | ICD-10-CM

## 2011-05-01 DIAGNOSIS — N760 Acute vaginitis: Secondary | ICD-10-CM

## 2011-05-01 HISTORY — DX: Hyperlipidemia, unspecified: E78.5

## 2011-05-01 LAB — HEPATIC FUNCTION PANEL
ALT: 15 U/L (ref 0–35)
AST: 15 U/L (ref 0–37)
Albumin: 4 g/dL (ref 3.5–5.2)
Alkaline Phosphatase: 37 U/L — ABNORMAL LOW (ref 39–117)
Total Protein: 7.2 g/dL (ref 6.0–8.3)

## 2011-05-01 LAB — RENAL FUNCTION PANEL
Albumin: 4 g/dL (ref 3.5–5.2)
BUN: 13 mg/dL (ref 6–23)
CO2: 27 mEq/L (ref 19–32)
Calcium: 8.9 mg/dL (ref 8.4–10.5)
Creatinine, Ser: 0.7 mg/dL (ref 0.4–1.2)
Glucose, Bld: 92 mg/dL (ref 70–99)

## 2011-05-01 LAB — CBC
HCT: 36 % (ref 36.0–46.0)
Hemoglobin: 12.9 g/dL (ref 12.0–15.0)
MCV: 88.9 fL (ref 78.0–100.0)
RBC: 4.05 MIL/uL (ref 3.87–5.11)
WBC: 6.6 10*3/uL (ref 4.0–10.5)

## 2011-05-01 LAB — TSH: TSH: 0.32 u[IU]/mL — ABNORMAL LOW (ref 0.35–5.50)

## 2011-05-01 LAB — LIPID PANEL
Total CHOL/HDL Ratio: 4
VLDL: 71.6 mg/dL — ABNORMAL HIGH (ref 0.0–40.0)

## 2011-05-01 MED ORDER — TETANUS-DIPHTH-ACELL PERTUSSIS 5-2.5-18.5 LF-MCG/0.5 IM SUSP: 0.5000 mL | Freq: Once | INTRAMUSCULAR | Status: DC

## 2011-05-01 NOTE — Progress Notes (Signed)
Brittney Townsend [id number] [birth year] 05/01/2011      Progress Note-Follow Up  Subjective  Chief Complaint  Chief Complaint  Patient presents with  . Follow-up    6 month follow up    HPI  40 year old Caucasian female who is in today for followup of multiple medical problems. She is feeling much better than she did at her last visit. No further epigastric pain. No dyspepsia. No nausea vomiting, epigastric pain at this time. She reports her cystitis and vaginitis are well controlled at present no recent illness, fevers, chills, chest pain, palpitations, shortness of breath, GI or GU complaints.  Past Medical History  Diagnosis Date  . Allergy     seasonal  . Anxiety   . Migraines   . Overweight 09/28/2010  . Other and unspecified ovarian cyst 09/28/2010  . MIGRAINE HEADACHE 09/28/2010  . 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 HYPERTRIGLYCERIDEMIA 09/28/2010  . GERD 09/28/2010  . CHEST PAIN, ATYPICAL 09/28/2010  . ANXIETY DISORDER 09/28/2010  . Anxiety disorder 11/02/2010  . Knee pain, left 12/12/2010  . Sinusitis acute 12/12/2010  . Environmental allergies 12/12/2010  . Vaginitis and vulvovaginitis 12/24/2010  . Acute epigastric pain 04/15/2011  . Hyperlipidemia 05/01/2011    Past Surgical History  Procedure Date  . Abdominal hysterectomy 2008    total  . Back surgery 2009    L5 fracture required pins, rod, fusion L4-L5 fusion  . Elbow surgery 2011    carpal tunnel release on right  . Cesarean section 1998, 2003    Family History  Problem Relation Age of Onset  . Hypertension Mother   . Depression Mother   . Hyperlipidemia Father   . Diabetes Father     type 2  . Cancer Maternal Grandmother     Lung/ previous smoker  . Hypertension Maternal Grandmother   . Hyperlipidemia Maternal Grandmother   . Other Maternal Grandfather     renal failure  . Heart disease Maternal Grandfather   . Hypertension Maternal Grandfather   . Hyperlipidemia Maternal Grandfather   . Diabetes  Paternal Grandmother   . Hypertension Paternal Grandmother   . Hyperlipidemia Paternal Grandmother   . Hypertension Paternal Grandfather   . Hyperlipidemia Paternal Grandfather   . Cancer Paternal Grandfather     skin  . Depression Brother   . Obesity Son   . Heart attack Paternal Aunt     smoker  . Heart attack Paternal Uncle     smoker    History   Social History  . Marital Status: Married    Spouse Name: N/A    Number of Children: N/A  . Years of Education: N/A   Occupational History  . Not on file.   Social History Main Topics  . Smoking status: Never Smoker   . Smokeless tobacco: Never Used  . Alcohol Use: Yes     rare, special occasions  . Drug Use: No  . Sexually Active: Yes -- Female partner(s)   Other Topics Concern  . Not on file   Social History Narrative  . No narrative on file    Current Outpatient Prescriptions on File Prior to Visit  Medication Sig Dispense Refill  . ALPRAZolam (XANAX) 0.25 MG tablet 1/2 to 1 tablet by mouth twice daily as needed.  20 tablet  1  . Cetirizine HCl 10 MG CAPS Take 1 capsule (10 mg total) by mouth daily.  30 capsule  0  . dexlansoprazole (DEXILANT) 60 MG capsule Take 1 capsule (60 mg  total) by mouth daily.  30 capsule  5  . pentosan polysulfate (ELMIRON) 100 MG capsule Take 100 mg by mouth 3 (three) times daily before meals.       Marland Kitchen PREMARIN 1.25 MG tablet TAKE 1 TABLET (1.25 MG TOTAL) BY MOUTH DAILY.  30 tablet  3  . Probiotic Product (PRO-FLORA CONCENTRATE) CAPS Take 1 capsule by mouth daily.        Enid Baas & Cleansers (CETAPHIL GENTLE CLEANSING) BAR Apply topically. Wash daily affected       . SUMAtriptan (IMITREX) 100 MG tablet Take 100 mg by mouth every 2 (two) hours as needed.        . naproxen (NAPROSYN) 375 MG tablet Take 1 tablet (375 mg total) by mouth 2 (two) times daily as needed (pain, with food).  60 tablet  2  . traMADol (ULTRAM) 50 MG tablet Take 1 tablet (50 mg total) by mouth every 8 (eight) hours as  needed for pain (severe pain).  20 tablet  0   No current facility-administered medications on file prior to visit.    Allergies  Allergen Reactions  . Sulfonamide Derivatives     REACTION: hives    Review of Systems  Review of Systems  Constitutional: Negative for fever and malaise/fatigue.  HENT: Negative for congestion.   Eyes: Negative for discharge.  Respiratory: Negative for shortness of breath.   Cardiovascular: Negative for chest pain, palpitations and leg swelling.  Gastrointestinal: Negative for nausea, abdominal pain and diarrhea.  Genitourinary: Negative for dysuria.  Musculoskeletal: Negative for falls.  Skin: Negative for rash.  Neurological: Negative for loss of consciousness and headaches.  Endo/Heme/Allergies: Negative for polydipsia.  Psychiatric/Behavioral: Negative for depression and suicidal ideas. The patient is not nervous/anxious and does not have insomnia.     Objective  BP 139/87  Pulse 79  Temp(Src) 98.1 F (36.7 C) (Oral)  Ht 5\' 3"  (1.6 m)  Wt 167 lb 12.8 oz (76.114 kg)  BMI 29.72 kg/m2  SpO2 98%  Physical Exam  Physical Exam  Constitutional: She is oriented to person, place, and time and well-developed, well-nourished, and in no distress. No distress.  HENT:  Head: Normocephalic and atraumatic.  Eyes: Conjunctivae are normal.  Neck: Neck supple. No thyromegaly present.  Cardiovascular: Normal rate, regular rhythm and normal heart sounds.   No murmur heard. Pulmonary/Chest: Effort normal and breath sounds normal. She has no wheezes.  Abdominal: She exhibits no distension and no mass.  Musculoskeletal: She exhibits no edema.  Lymphadenopathy:    She has no cervical adenopathy.  Neurological: She is alert and oriented to person, place, and time.  Skin: Skin is warm and dry. No rash noted. She is not diaphoretic.  Psychiatric: Memory, affect and judgment normal.    Lab Results  Component Value Date   TSH 0.71 10/29/2010   Lab  Results  Component Value Date   WBC 6.6 04/15/2011   HGB 13.3 04/15/2011   HCT 39.1 04/15/2011   MCV 91.7 04/15/2011   PLT 221.0 04/15/2011   Lab Results  Component Value Date   CREATININE 0.7 04/15/2011   BUN 13 04/15/2011   NA 139 04/15/2011   K 4.4 04/15/2011   CL 104 04/15/2011   CO2 25 04/15/2011   Lab Results  Component Value Date   ALT 14 04/15/2011   AST 16 04/15/2011   ALKPHOS 42 04/15/2011   BILITOT 0.3 04/15/2011   Lab Results  Component Value Date   CHOL 222* 10/29/2010  Lab Results  Component Value Date   HDL 53.20 10/29/2010   No results found for this basename: [facility]   Lab Results  Component Value Date   TRIG 233.0* 10/29/2010   Lab Results  Component Value Date   CHOLHDL 4 10/29/2010     Assessment & Plan  GERD No recent bad episodes, continue avoiding offending foods. Continue Dexilant and probiotic daily  Vaginitis and vulvovaginitis Improved currently, may continue to use Diflucan prn, has been struggling with this for many years. Minimize simple carbs  Preventative health care Flu shot, Tdap given today. Labs drawn today. Will return in 6 months for annual pap, on Premarin, and ordering of MGM at age 8  INTERSTITIAL CYSTITIS Asymptomatic at present  Disorder of phosphorus metabolism Recent low phosphorus, treated, willrepeat renal today to evaluate  Hyperlipidemia Avoid trans fats, add a MegaRed daily, repeat lipid panel today

## 2011-05-01 NOTE — Assessment & Plan Note (Signed)
Asymptomatic at present.

## 2011-05-01 NOTE — Assessment & Plan Note (Addendum)
Flu shot, Tdap given today. Labs drawn today. Will return in 6 months for annual pap, on Premarin, and ordering of MGM at age 40

## 2011-05-01 NOTE — Assessment & Plan Note (Signed)
No recent bad episodes, continue avoiding offending foods. Continue Dexilant and probiotic daily

## 2011-05-01 NOTE — Patient Instructions (Signed)

## 2011-05-01 NOTE — Assessment & Plan Note (Signed)
Avoid trans fats, add a MegaRed daily, repeat lipid panel today

## 2011-05-01 NOTE — Assessment & Plan Note (Signed)
Improved currently, may continue to use Diflucan prn, has been struggling with this for many years. Minimize simple carbs

## 2011-05-01 NOTE — Assessment & Plan Note (Signed)
Recent low phosphorus, treated, willrepeat renal today to evaluate

## 2011-05-07 ENCOUNTER — Telehealth: Payer: Self-pay

## 2011-05-07 NOTE — Telephone Encounter (Signed)
Agree free T4 is normal, recommend repeat TSH and fre T4 in roughly 4 months

## 2011-05-07 NOTE — Telephone Encounter (Signed)
Pt states her email said she had a message for her in [place] but when she went to it there was nothing there. Pt is awaiting Free T4 results? I told pt they were in range and if she did not hear back from me everything was okay and if MD wanted to change something I would call her back? Please advise?

## 2011-05-07 NOTE — Telephone Encounter (Signed)
Patient informed. 

## 2011-05-28 ENCOUNTER — Other Ambulatory Visit: Payer: Self-pay | Admitting: Family Medicine

## 2011-05-28 MED ORDER — FLUCONAZOLE 150 MG PO TABS: ORAL_TABLET | ORAL | Status: DC

## 2011-05-28 NOTE — Telephone Encounter (Signed)
I OKed this then realized it needs a sig, please resend with sig: 1 tab po once and may repeat in 1 week as needed

## 2011-05-28 NOTE — Telephone Encounter (Signed)
Please advise 

## 2011-07-05 ENCOUNTER — Other Ambulatory Visit: Payer: Self-pay

## 2011-07-05 DIAGNOSIS — F411 Generalized anxiety disorder: Secondary | ICD-10-CM

## 2011-07-05 DIAGNOSIS — Z9109 Other allergy status, other than to drugs and biological substances: Secondary | ICD-10-CM

## 2011-07-05 MED ORDER — ALPRAZOLAM 0.25 MG PO TABS: ORAL_TABLET | ORAL | Status: DC

## 2011-07-05 NOTE — Telephone Encounter (Signed)
RX faxed to [facility].

## 2011-07-05 NOTE — Telephone Encounter (Signed)
Please advise refill on Xanax

## 2011-07-10 ENCOUNTER — Telehealth: Payer: Self-pay

## 2011-07-10 NOTE — Telephone Encounter (Signed)
Pt left a message stating that she has taken 2 of the Diflucan and it doesn't seem to be helping? Pt would like Flagyl called into the pharmacy? Pt states she thinks that maybe its more of a bacterial infection? Please advise? Callback number: [phone]

## 2011-07-10 NOTE — Telephone Encounter (Signed)
OK to call in Flaygl generic 500mg , 1 tab po bid x 7d, #14, no RF. Needs to come in for exam/wet prep if not improved after this--PM

## 2011-07-11 MED ORDER — METRONIDAZOLE 500 MG PO TABS: 500.0000 mg | ORAL_TABLET | Freq: Two times a day (BID) | ORAL | Status: DC

## 2011-07-11 NOTE — Telephone Encounter (Signed)
Pt informed. RX sent to pharmacy. 

## 2011-08-03 ENCOUNTER — Other Ambulatory Visit: Payer: Self-pay | Admitting: Family Medicine

## 2011-08-07 ENCOUNTER — Other Ambulatory Visit: Payer: Self-pay | Admitting: Family Medicine

## 2011-08-08 NOTE — Telephone Encounter (Signed)
Please advise 

## 2011-08-14 ENCOUNTER — Other Ambulatory Visit: Payer: Self-pay | Admitting: Family Medicine

## 2011-08-14 ENCOUNTER — Telehealth: Payer: Self-pay

## 2011-08-14 MED ORDER — FLUCONAZOLE 150 MG PO TABS: ORAL_TABLET | ORAL | Status: DC

## 2011-08-14 NOTE — Telephone Encounter (Signed)
Pt left a message stating she has white, clumpy discharge with itching and burning? Pt stated in message she is going to be leaving for [place].  Per MD go ahead and refill same script as before.

## 2011-09-06 ENCOUNTER — Other Ambulatory Visit: Payer: Self-pay

## 2011-09-06 DIAGNOSIS — F411 Generalized anxiety disorder: Secondary | ICD-10-CM

## 2011-09-06 MED ORDER — ALPRAZOLAM 0.25 MG PO TABS: ORAL_TABLET | ORAL | Status: DC

## 2011-09-06 NOTE — Telephone Encounter (Signed)
OK to refill with same sig and same #refills

## 2011-09-06 NOTE — Telephone Encounter (Signed)
Please advise refill? Last RX wrote for # 20 X 1 refill on 07-05-11

## 2011-09-24 ENCOUNTER — Other Ambulatory Visit: Payer: Self-pay | Admitting: Family Medicine

## 2011-10-07 ENCOUNTER — Other Ambulatory Visit: Payer: Self-pay | Admitting: *Deleted

## 2011-10-07 DIAGNOSIS — F411 Generalized anxiety disorder: Secondary | ICD-10-CM

## 2011-10-07 MED ORDER — ALPRAZOLAM 0.25 MG PO TABS: ORAL_TABLET | ORAL | Status: DC

## 2011-10-07 NOTE — Telephone Encounter (Signed)
Faxed refill request received from pharmacy for alprazolam Last seen on 05/01/11 Follow up in 10/2011 Last filled on 09/06/11. Please advise refill.

## 2011-10-07 NOTE — Telephone Encounter (Signed)
Verbal OK to refill from Dr. Abner Greenspan.  RX printed and signed by Dr. Abner Greenspan.

## 2011-10-22 ENCOUNTER — Ambulatory Visit: Payer: BC Managed Care – PPO | Admitting: Family Medicine

## 2011-10-22 DIAGNOSIS — Z0289 Encounter for other administrative examinations: Secondary | ICD-10-CM

## 2011-10-24 ENCOUNTER — Ambulatory Visit (INDEPENDENT_AMBULATORY_CARE_PROVIDER_SITE_OTHER): Payer: BC Managed Care – PPO | Admitting: Family Medicine

## 2011-10-24 ENCOUNTER — Encounter: Payer: Self-pay | Admitting: Family Medicine

## 2011-10-24 DIAGNOSIS — R030 Elevated blood-pressure reading, without diagnosis of hypertension: Secondary | ICD-10-CM

## 2011-10-24 DIAGNOSIS — J019 Acute sinusitis, unspecified: Secondary | ICD-10-CM

## 2011-10-24 DIAGNOSIS — T7840XA Allergy, unspecified, initial encounter: Secondary | ICD-10-CM | POA: Insufficient documentation

## 2011-10-24 MED ORDER — GUAIFENESIN ER 600 MG PO TB12: 600.0000 mg | ORAL_TABLET | Freq: Two times a day (BID) | ORAL | Status: DC

## 2011-10-24 MED ORDER — AMOXICILLIN-POT CLAVULANATE 875-125 MG PO TABS: 1.0000 | ORAL_TABLET | Freq: Two times a day (BID) | ORAL | Status: AC

## 2011-10-24 MED ORDER — CETIRIZINE HCL 10 MG PO TABS: ORAL_TABLET | ORAL | Status: DC

## 2011-10-24 MED ORDER — FLUTICASONE PROPIONATE 50 MCG/ACT NA SUSP: 2.0000 | Freq: Every day | NASAL | Status: DC | PRN

## 2011-10-24 MED ORDER — DIPHENHYDRAMINE HCL 25 MG PO TABS: 25.0000 mg | ORAL_TABLET | Freq: Every evening | ORAL | Status: DC | PRN

## 2011-10-24 MED ORDER — FLUCONAZOLE 150 MG PO TABS: ORAL_TABLET | ORAL | Status: DC

## 2011-10-24 NOTE — Patient Instructions (Signed)

## 2011-10-24 NOTE — Progress Notes (Signed)
Patient ID: Brittney Townsend, female   DOB: 08-22-1970, 41 y.o.   MRN: [id number] CAI FLOTT [id number] Jan 23, 1971 10/24/2011      Progress Note-Follow Up  Subjective  Chief Complaint  Chief Complaint  Patient presents with  . Sinusitis    HPI  Patient is a 41 year old Caucasian female who is in today with a 2 to three-month history of persistent respiratory symptoms. She notes that sometime in February she's been struggling with significant environmental allergies. She is remodeling her house and has had a lot of dust around. She has chronic nasal congestion and irritation. The itching and intermittent infections. She has intermittent episodes of fevers, chills, headache, malaise, myalgias, ear pain. She has minor irritation in her throat and occasional cough. Her nasal congestion is productive sometimes of yellow and sometimes clear phlegm. She denies chest pain or palpitations but does feel tight in her chest at times. Denies any GI or GU complaints such as nausea, vomiting, diarrhea or anorexia. She has taken multiple medications over the counter including Allegra and cetirizine as well as Mucinex with only minimal improvement.  Past Medical History  Diagnosis Date  . Allergy     seasonal  . Anxiety   . Migraines   . Overweight 09/28/2010  . Other and unspecified ovarian cyst 09/28/2010  . MIGRAINE HEADACHE 09/28/2010  . 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 HYPERTRIGLYCERIDEMIA 09/28/2010  . GERD 09/28/2010  . CHEST PAIN, ATYPICAL 09/28/2010  . ANXIETY DISORDER 09/28/2010  . Anxiety disorder 11/02/2010  . Knee pain, left 12/12/2010  . Sinusitis acute 12/12/2010  . Environmental allergies 12/12/2010  . Vaginitis and vulvovaginitis 12/24/2010  . Acute epigastric pain 04/15/2011  . Hyperlipidemia 05/01/2011  . Allergic state 10/24/2011  . Elevated BP 10/24/2011    Past Surgical History  Procedure Date  . Abdominal hysterectomy 2008    total  . Back surgery 2009    L5 fracture required  pins, rod, fusion L4-L5 fusion  . Elbow surgery 2011    carpal tunnel release on right  . Cesarean section 1998, 2003    Family History  Problem Relation Age of Onset  . Hypertension Mother   . Depression Mother   . Hyperlipidemia Father   . Diabetes Father     type 2  . Cancer Maternal Grandmother     Lung/ previous smoker  . Hypertension Maternal Grandmother   . Hyperlipidemia Maternal Grandmother   . Other Maternal Grandfather     renal failure  . Heart disease Maternal Grandfather   . Hypertension Maternal Grandfather   . Hyperlipidemia Maternal Grandfather   . Diabetes Paternal Grandmother   . Hypertension Paternal Grandmother   . Hyperlipidemia Paternal Grandmother   . Hypertension Paternal Grandfather   . Hyperlipidemia Paternal Grandfather   . Cancer Paternal Grandfather     skin  . Depression Brother   . Obesity Son   . Heart attack Paternal Aunt     smoker  . Heart attack Paternal Uncle     smoker    History   Social History  . Marital Status: Married    Spouse Name: N/A    Number of Children: N/A  . Years of Education: N/A   Occupational History  . Not on file.   Social History Main Topics  . Smoking status: Never Smoker   . Smokeless tobacco: Never Used  . Alcohol Use: Yes     rare, special occasions  . Drug Use: No  . Sexually Active: Yes --  Female partner(s)   Other Topics Concern  . Not on file   Social History Narrative  . No narrative on file    Current Outpatient Prescriptions on File Prior to Visit  Medication Sig Dispense Refill  . ALPRAZolam (XANAX) 0.25 MG tablet 1/2 to 1 tablet by mouth twice daily as needed.  20 tablet  0  . Cetirizine HCl 10 MG CAPS Take 1 capsule (10 mg total) by mouth daily.  30 capsule  0  . DEXILANT 60 MG capsule TAKE 1 CAPSULE (60 MG TOTAL) BY MOUTH DAILY.  30 capsule  5  . diphenhydrAMINE (BENADRYL) 25 MG tablet Take 1 tablet (25 mg total) by mouth at bedtime as needed for itching, allergies or sleep.   30 tablet  0  . fluticasone (FLONASE) 50 MCG/ACT nasal spray Place 2 sprays into the nose daily as needed for rhinitis or allergies.  16 g  6  . metroNIDAZOLE (FLAGYL) 500 MG tablet Take 1 tablet (500 mg total) by mouth 2 (two) times daily.  14 tablet  0  . naproxen (NAPROSYN) 375 MG tablet Take 1 tablet (375 mg total) by mouth 2 (two) times daily as needed (pain, with food).  60 tablet  2  . pentosan polysulfate (ELMIRON) 100 MG capsule Take 100 mg by mouth 3 (three) times daily before meals.       Marland Kitchen PREMARIN 1.25 MG tablet TAKE 1 TABLET (1.25 MG TOTAL) BY MOUTH DAILY.  30 tablet  3  . Probiotic Product (PRO-FLORA CONCENTRATE) CAPS Take 1 capsule by mouth daily.        . promethazine (PHENERGAN) 25 MG tablet Take 1 tablet (25 mg total) by mouth every 8 (eight) hours as needed for nausea.  30 tablet  0  . promethazine (PHENERGAN) 25 MG tablet       . Soap & Cleansers (CETAPHIL GENTLE CLEANSING) BAR Apply topically. Wash daily affected       . SUMAtriptan (IMITREX) 100 MG tablet Take 100 mg by mouth every 2 (two) hours as needed.        . traMADol (ULTRAM) 50 MG tablet Take 1 tablet (50 mg total) by mouth every 8 (eight) hours as needed for pain (severe pain).  20 tablet  0    Allergies  Allergen Reactions  . Sulfonamide Derivatives     REACTION: hives    Review of Systems  Review of Systems  Constitutional: Positive for fever and chills. Negative for malaise/fatigue.  HENT: Positive for ear pain and congestion.   Eyes: Negative for discharge.  Respiratory: Positive for cough and sputum production. Negative for shortness of breath.   Cardiovascular: Negative for chest pain, palpitations and leg swelling.  Gastrointestinal: Negative for nausea, abdominal pain and diarrhea.  Genitourinary: Negative for dysuria.  Musculoskeletal: Negative for falls.  Skin: Negative for rash.  Neurological: Negative for loss of consciousness and headaches.  Endo/Heme/Allergies: Negative for polydipsia.    Psychiatric/Behavioral: Negative for depression and suicidal ideas. The patient is not nervous/anxious and does not have insomnia.     Objective  BP 157/105  Pulse 83  Temp(Src) 98.8 F (37.1 C) (Temporal)  Ht 5\' 3"  (1.6 m)  Wt 173 lb (78.472 kg)  BMI 30.65 kg/m2  SpO2 98%  Physical Exam  Physical Exam  Constitutional: She is oriented to person, place, and time and well-developed, well-nourished, and in no distress. No distress.  HENT:  Head: Normocephalic and atraumatic.       TMs dull and mildly erythematous,  nasal mucosa boggy and erythematous  Eyes: Conjunctivae are normal.  Neck: Neck supple. No thyromegaly present.  Cardiovascular: Normal rate, regular rhythm and normal heart sounds.   No murmur heard. Pulmonary/Chest: Effort normal and breath sounds normal. She has no wheezes.  Abdominal: She exhibits no distension and no mass.  Musculoskeletal: She exhibits no edema.  Lymphadenopathy:    She has no cervical adenopathy.  Neurological: She is alert and oriented to person, place, and time.  Skin: Skin is warm and dry. No rash noted. She is not diaphoretic.  Psychiatric: Memory, affect and judgment normal.    Lab Results  Component Value Date   TSH 0.32* 05/01/2011   Lab Results  Component Value Date   WBC 6.6 05/01/2011   HGB 12.9 05/01/2011   HCT 36.0 05/01/2011   MCV 88.9 05/01/2011   PLT 206 05/01/2011   Lab Results  Component Value Date   CREATININE 0.7 05/01/2011   BUN 13 05/01/2011   NA 138 05/01/2011   K 4.3 05/01/2011   CL 103 05/01/2011   CO2 27 05/01/2011   Lab Results  Component Value Date   ALT 15 05/01/2011   AST 15 05/01/2011   ALKPHOS 37* 05/01/2011   BILITOT 0.3 05/01/2011   Lab Results  Component Value Date   CHOL 209* 05/01/2011   Lab Results  Component Value Date   HDL 50.60 05/01/2011   No results found for this basename: [facility]   Lab Results  Component Value Date   TRIG 358.0* 05/01/2011   Lab Results   Component Value Date   CHOLHDL 4 05/01/2011     Assessment & Plan  Sinusitis acute Patient struggling with symptoms off and on since February, they have been remodeling their house and she keeps being perpetually irritated. She is given on Augmentin and Mucinex to use twice a day. She is asked to increase rest and fluids to  Allergic state Cetirizine bid, Flonase daily, nasal saline prn and may use Benadryl 25 mg qhs prn if unable to breath at night  Elevated BP Patient acutely ill and anxious today will reevaluate at next visit

## 2011-10-24 NOTE — Assessment & Plan Note (Signed)
Patient acutely ill and anxious today will reevaluate at next visit

## 2011-10-24 NOTE — Assessment & Plan Note (Signed)
Cetirizine bid, Flonase daily, nasal saline prn and may use Benadryl 25 mg qhs prn if unable to breath at night

## 2011-10-24 NOTE — Assessment & Plan Note (Signed)
Patient struggling with symptoms off and on since February, they have been remodeling their house and she keeps being perpetually irritated. She is given on Augmentin and Mucinex to use twice a day. She is asked to increase rest and fluids to

## 2011-11-05 ENCOUNTER — Encounter (INDEPENDENT_AMBULATORY_CARE_PROVIDER_SITE_OTHER): Payer: BC Managed Care – PPO | Admitting: General Surgery

## 2011-11-06 ENCOUNTER — Other Ambulatory Visit: Payer: Self-pay

## 2011-11-06 DIAGNOSIS — F411 Generalized anxiety disorder: Secondary | ICD-10-CM

## 2011-11-06 MED ORDER — ALPRAZOLAM 0.25 MG PO TABS: ORAL_TABLET | ORAL | Status: DC

## 2011-11-06 NOTE — Telephone Encounter (Signed)
Please advise? Last refill on 10-07-11 quantity 20 and 0 refills  Fax# [phone]

## 2011-11-07 NOTE — Telephone Encounter (Signed)
Faxed to pharmacy

## 2011-11-28 ENCOUNTER — Other Ambulatory Visit: Payer: Self-pay

## 2011-11-28 DIAGNOSIS — F411 Generalized anxiety disorder: Secondary | ICD-10-CM

## 2011-11-28 MED ORDER — ALPRAZOLAM 0.25 MG PO TABS: ORAL_TABLET | ORAL | Status: DC

## 2011-11-28 NOTE — Telephone Encounter (Signed)
Faxed

## 2011-11-28 NOTE — Telephone Encounter (Signed)
Please advise? Last RX wrote on 11-06-11 quantity 20 with 0 refills. Fax# [phone]

## 2011-12-06 ENCOUNTER — Encounter (INDEPENDENT_AMBULATORY_CARE_PROVIDER_SITE_OTHER): Payer: BC Managed Care – PPO | Admitting: General Surgery

## 2011-12-19 ENCOUNTER — Other Ambulatory Visit: Payer: Self-pay | Admitting: Family Medicine

## 2011-12-19 NOTE — Telephone Encounter (Signed)
Please advise 

## 2011-12-20 ENCOUNTER — Other Ambulatory Visit: Payer: Self-pay | Admitting: Family Medicine

## 2011-12-20 DIAGNOSIS — F411 Generalized anxiety disorder: Secondary | ICD-10-CM

## 2011-12-20 MED ORDER — ALPRAZOLAM 0.25 MG PO TABS: ORAL_TABLET | ORAL | Status: DC

## 2011-12-20 NOTE — Telephone Encounter (Signed)
OK to refill Alprazolam 0.25 mg 1/2 to 1 tab po bid prn anxiety, #60, 1 rf

## 2011-12-20 NOTE — Telephone Encounter (Signed)
RX printed and faxed. 

## 2011-12-30 ENCOUNTER — Other Ambulatory Visit: Payer: Self-pay

## 2011-12-30 MED ORDER — ESTROGENS CONJUGATED 1.25 MG PO TABS: 1.2500 mg | ORAL_TABLET | Freq: Every day | ORAL | Status: DC

## 2011-12-30 NOTE — Telephone Encounter (Signed)
RX sent to pharmacy  

## 2012-01-23 ENCOUNTER — Encounter (INDEPENDENT_AMBULATORY_CARE_PROVIDER_SITE_OTHER): Payer: BC Managed Care – PPO | Admitting: General Surgery

## 2012-02-05 ENCOUNTER — Other Ambulatory Visit: Payer: Self-pay

## 2012-02-05 MED ORDER — DEXLANSOPRAZOLE 60 MG PO CPDR: 60.0000 mg | DELAYED_RELEASE_CAPSULE | Freq: Every day | ORAL | Status: DC

## 2012-03-31 ENCOUNTER — Other Ambulatory Visit: Payer: Self-pay

## 2012-03-31 DIAGNOSIS — F411 Generalized anxiety disorder: Secondary | ICD-10-CM

## 2012-03-31 MED ORDER — ALPRAZOLAM 0.25 MG PO TABS: ORAL_TABLET | ORAL | Status: DC

## 2012-03-31 NOTE — Telephone Encounter (Signed)
RX faxed to pharmacy.

## 2012-03-31 NOTE — Telephone Encounter (Signed)
OK to refill with same sig, same number 1 refill

## 2012-03-31 NOTE — Telephone Encounter (Signed)
Please advise Alprazolam refill? Last RX wrote on 12-20-11 quantity 60 with 1 refill.  [phone]

## 2012-04-15 ENCOUNTER — Telehealth: Payer: Self-pay

## 2012-04-15 MED ORDER — TRAZODONE HCL 50 MG PO TABS: 50.0000 mg | ORAL_TABLET | Freq: Every day | ORAL | Status: DC

## 2012-04-15 NOTE — Telephone Encounter (Signed)
OK to try Trazodone but should come in to discuss how it is working a couple of weeks after she starts it Trazodone 50 mg tabs, 1 tab po qhs prn insomnia, disp # 30 and when she comes in we can decide if this is the right dose or if she needs a different strength

## 2012-04-15 NOTE — Telephone Encounter (Signed)
Patient informed and voiced understadement. Pt states she will call back to schedule an appt for 2 weeks.

## 2012-04-15 NOTE — Addendum Note (Signed)
Addended by: Court Joy on: 04/15/2012 04:31 PM   Modules accepted: Orders

## 2012-04-15 NOTE — Telephone Encounter (Signed)
Patient left a message stating that she would like something else to help her sleep? Pt stated that the Xanax helps "calm" her down but doesn't help with sleep, Ambien "wires" her up, and Trazodone has helped in the past? [facility]. Please advise? 

## 2012-04-15 NOTE — Telephone Encounter (Signed)
Patient left a message stating that she would like something else to help her sleep? Pt stated that the Xanax helps "calm" her down but doesn't help with sleep, Ambien "wires" her up, and Trazodone has helped in the past? [facility] Please advise?

## 2012-04-21 ENCOUNTER — Other Ambulatory Visit: Payer: Self-pay

## 2012-04-21 MED ORDER — ESTROGENS CONJUGATED 1.25 MG PO TABS: 1.2500 mg | ORAL_TABLET | Freq: Every day | ORAL | Status: DC

## 2012-05-04 ENCOUNTER — Ambulatory Visit (INDEPENDENT_AMBULATORY_CARE_PROVIDER_SITE_OTHER): Payer: BC Managed Care – PPO | Admitting: Family Medicine

## 2012-05-04 ENCOUNTER — Ambulatory Visit: Payer: BC Managed Care – PPO | Admitting: Family Medicine

## 2012-05-04 ENCOUNTER — Encounter: Payer: Self-pay | Admitting: Family Medicine

## 2012-05-04 VITALS — BP 136/69 | HR 104 | Temp 99.0°F | Ht 63.0 in | Wt 173.0 lb

## 2012-05-04 DIAGNOSIS — E785 Hyperlipidemia, unspecified: Secondary | ICD-10-CM

## 2012-05-04 DIAGNOSIS — J019 Acute sinusitis, unspecified: Secondary | ICD-10-CM

## 2012-05-04 DIAGNOSIS — R030 Elevated blood-pressure reading, without diagnosis of hypertension: Secondary | ICD-10-CM

## 2012-05-04 DIAGNOSIS — G4700 Insomnia, unspecified: Secondary | ICD-10-CM

## 2012-05-04 DIAGNOSIS — H6690 Otitis media, unspecified, unspecified ear: Secondary | ICD-10-CM

## 2012-05-04 MED ORDER — CEFDINIR 300 MG PO CAPS: 300.0000 mg | ORAL_CAPSULE | Freq: Two times a day (BID) | ORAL | Status: AC

## 2012-05-04 MED ORDER — TRAZODONE HCL 50 MG PO TABS: 50.0000 mg | ORAL_TABLET | Freq: Every day | ORAL | Status: DC

## 2012-05-04 NOTE — Patient Instructions (Addendum)

## 2012-05-09 ENCOUNTER — Encounter: Payer: Self-pay | Admitting: Family Medicine

## 2012-05-09 NOTE — Assessment & Plan Note (Signed)
Started on antibiotics and probiotics daily

## 2012-05-09 NOTE — Progress Notes (Signed)
Patient ID: Brittney Townsend, female   DOB: [birth year], 41 y.o.   MRN: [id number] Brittney Townsend [id number] [birth year] 05/09/2012      Progress Note-Follow Up  Subjective  Chief Complaint  Chief Complaint  Patient presents with  . Otalgia    left ear X 4 days, and sore throat    HPI  Patient is a 41 year old Caucasian female who is in today for routine follow up has been struggling with a head cold for 2 weeks now. She notes the headache and sore throat it began last few days. She has some ear pain most notably in the left as well. She is fatigue malaise myalgias and nasal congestion. Dry cough has been present for several weeks now but no wheezing. She denies fevers, nausea vomiting or diarrhea. No chest pain or palpitations. Past Medical History  Diagnosis Date  . Allergy     seasonal  . Anxiety   . Migraines   . Overweight 09/28/2010  . Other and unspecified ovarian cyst 09/28/2010  . MIGRAINE HEADACHE 09/28/2010  . 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 HYPERTRIGLYCERIDEMIA 09/28/2010  . GERD 09/28/2010  . CHEST PAIN, ATYPICAL 09/28/2010  . ANXIETY DISORDER 09/28/2010  . Anxiety disorder 11/02/2010  . Knee pain, left 12/12/2010  . Sinusitis acute 12/12/2010  . Environmental allergies 12/12/2010  . Vaginitis and vulvovaginitis 12/24/2010  . Acute epigastric pain 04/15/2011  . Hyperlipidemia 05/01/2011  . Allergic state 10/24/2011  . Elevated BP 10/24/2011  . Otitis media 05/09/2012    Past Surgical History  Procedure Date  . Abdominal hysterectomy 2008    total  . Back surgery 2009    L5 fracture required pins, rod, fusion L4-L5 fusion  . Elbow surgery 2011    carpal tunnel release on right  . Cesarean section 1998, 2003    Family History  Problem Relation Age of Onset  . Hypertension Mother   . Depression Mother   . Hyperlipidemia Father   . Diabetes Father     type 2  . Cancer Maternal Grandmother     Lung/ previous smoker  . Hypertension Maternal Grandmother   .  Hyperlipidemia Maternal Grandmother   . Other Maternal Grandfather     renal failure  . Heart disease Maternal Grandfather   . Hypertension Maternal Grandfather   . Hyperlipidemia Maternal Grandfather   . Diabetes Paternal Grandmother   . Hypertension Paternal Grandmother   . Hyperlipidemia Paternal Grandmother   . Hypertension Paternal Grandfather   . Hyperlipidemia Paternal Grandfather   . Cancer Paternal Grandfather     skin  . Depression Brother   . Obesity Son   . Heart attack Paternal Aunt     smoker  . Heart attack Paternal Uncle     smoker    History   Social History  . Marital Status: Married    Spouse Name: N/A    Number of Children: N/A  . Years of Education: N/A   Occupational History  . Not on file.   Social History Main Topics  . Smoking status: Never Smoker   . Smokeless tobacco: Never Used  . Alcohol Use: Yes     rare, special occasions  . Drug Use: No  . Sexually Active: Yes -- Female partner(s)   Other Topics Concern  . Not on file   Social History Narrative  . No narrative on file    Current Outpatient Prescriptions on File Prior to Visit  Medication Sig Dispense Refill  . ALPRAZolam (XANAX) 0.25  MG tablet 1/2 to 1 tablet by mouth twice daily as needed.  60 tablet  1  . cetirizine (ZYRTEC) 10 MG tablet 1 tab twice daily as needed for congestion  30 tablet  11  . dexlansoprazole (DEXILANT) 60 MG capsule Take 1 capsule (60 mg total) by mouth daily.  30 capsule  5  . diphenhydrAMINE (BENADRYL) 25 MG tablet Take 1 tablet (25 mg total) by mouth at bedtime as needed for itching, allergies or sleep.  30 tablet  0  . estrogens, conjugated, (PREMARIN) 1.25 MG tablet Take 1 tablet (1.25 mg total) by mouth daily.  30 tablet  3  . fluticasone (FLONASE) 50 MCG/ACT nasal spray Place 2 sprays into the nose daily as needed for rhinitis or allergies.  16 g  6  . guaiFENesin (MUCINEX) 600 MG 12 hr tablet Take 1 tablet (600 mg total) by mouth 2 (two) times daily.   20 tablet  0  . pentosan polysulfate (ELMIRON) 100 MG capsule Take 100 mg by mouth 3 (three) times daily before meals.       . Probiotic Product (PRO-FLORA CONCENTRATE) CAPS Take 1 capsule by mouth daily.        . SUMAtriptan (IMITREX) 100 MG tablet Take 100 mg by mouth every 2 (two) hours as needed.        . traZODone (DESYREL) 50 MG tablet Take 1 tablet (50 mg total) by mouth at bedtime.  30 tablet  5  . fluconazole (DIFLUCAN) 150 MG tablet Take 1 tab po q week as directed  4 tablet  2  . promethazine (PHENERGAN) 25 MG tablet Take 1 tablet (25 mg total) by mouth every 8 (eight) hours as needed for nausea.  30 tablet  0    Allergies  Allergen Reactions  . Sulfonamide Derivatives     REACTION: hives    Review of Systems  Review of Systems  Constitutional: Positive for malaise/fatigue. Negative for fever.  HENT: Positive for ear pain, congestion and sore throat.   Eyes: Negative for discharge.  Respiratory: Positive for cough and sputum production. Negative for shortness of breath.   Cardiovascular: Negative for chest pain, palpitations and leg swelling.  Gastrointestinal: Negative for nausea, vomiting, abdominal pain and diarrhea.  Genitourinary: Negative for dysuria.  Musculoskeletal: Negative for falls.  Skin: Negative for rash.  Neurological: Positive for headaches. Negative for loss of consciousness.  Endo/Heme/Allergies: Negative for polydipsia.  Psychiatric/Behavioral: Negative for depression and suicidal ideas. The patient is not nervous/anxious and does not have insomnia.     Objective  BP 136/69  Pulse 104  Temp 99 F (37.2 C) (Temporal)  Ht 5\' 3"  (1.6 m)  Wt 173 lb (78.472 kg)  BMI 30.65 kg/m2  SpO2 97%  Physical Exam  Physical Exam  Constitutional: She is oriented to person, place, and time and well-developed, well-nourished, and in no distress. No distress.  HENT:  Head: Normocephalic and atraumatic.       TMs mildly erythematous and dull  Eyes:  Conjunctivae normal are normal.  Neck: Neck supple. No thyromegaly present.  Cardiovascular: Normal rate, regular rhythm and normal heart sounds.   No murmur heard. Pulmonary/Chest: Effort normal and breath sounds normal. She has no wheezes.  Abdominal: She exhibits no distension and no mass.  Musculoskeletal: She exhibits no edema.  Lymphadenopathy:    She has no cervical adenopathy.  Neurological: She is alert and oriented to person, place, and time.  Skin: Skin is warm and dry. No rash noted. She  is not diaphoretic.  Psychiatric: Memory, affect and judgment normal.    Lab Results  Component Value Date   TSH 0.32* 05/01/2011   Lab Results  Component Value Date   WBC 6.6 05/01/2011   HGB 12.9 05/01/2011   HCT 36.0 05/01/2011   MCV 88.9 05/01/2011   PLT 206 05/01/2011   Lab Results  Component Value Date   CREATININE 0.7 05/01/2011   BUN 13 05/01/2011   NA 138 05/01/2011   K 4.3 05/01/2011   CL 103 05/01/2011   CO2 27 05/01/2011   Lab Results  Component Value Date   ALT 15 05/01/2011   AST 15 05/01/2011   ALKPHOS 37* 05/01/2011   BILITOT 0.3 05/01/2011   Lab Results  Component Value Date   CHOL 209* 05/01/2011   Lab Results  Component Value Date   HDL 50.60 05/01/2011   No results found for this basename: [facility]   Lab Results  Component Value Date   TRIG 358.0* 05/01/2011   Lab Results  Component Value Date   CHOLHDL 4 05/01/2011     Assessment & Plan  Elevated BP Adequately controlled, no changes.  Otitis media Started on antibiotics and probiotics daily  Hyperlipidemia Patient agrees to return for fasting labs, avoid trans fats.

## 2012-05-09 NOTE — Assessment & Plan Note (Signed)
Patient agrees to return for fasting labs, avoid trans fats.

## 2012-05-09 NOTE — Assessment & Plan Note (Signed)
Adequately controlled, no changes 

## 2012-05-14 ENCOUNTER — Other Ambulatory Visit: Payer: Self-pay

## 2012-05-14 DIAGNOSIS — G4700 Insomnia, unspecified: Secondary | ICD-10-CM

## 2012-05-15 NOTE — Telephone Encounter (Signed)
Opened in error

## 2012-05-19 ENCOUNTER — Other Ambulatory Visit (INDEPENDENT_AMBULATORY_CARE_PROVIDER_SITE_OTHER): Payer: BC Managed Care – PPO

## 2012-05-19 ENCOUNTER — Ambulatory Visit (INDEPENDENT_AMBULATORY_CARE_PROVIDER_SITE_OTHER): Payer: BC Managed Care – PPO

## 2012-05-19 DIAGNOSIS — E785 Hyperlipidemia, unspecified: Secondary | ICD-10-CM

## 2012-05-19 DIAGNOSIS — Z23 Encounter for immunization: Secondary | ICD-10-CM

## 2012-05-19 DIAGNOSIS — R030 Elevated blood-pressure reading, without diagnosis of hypertension: Secondary | ICD-10-CM

## 2012-05-19 LAB — LIPID PANEL
HDL: 37.6 mg/dL — ABNORMAL LOW (ref >39.00)
Triglycerides: 882 mg/dL — ABNORMAL HIGH (ref 0.0–149.0)

## 2012-05-19 LAB — RENAL FUNCTION PANEL
Albumin: 3.9 g/dL (ref 3.5–5.2)
BUN: 16 mg/dL (ref 6–23)
Creatinine, Ser: 0.8 mg/dL (ref 0.4–1.2)
Glucose, Bld: 99 mg/dL (ref 70–99)
Phosphorus: 2.9 mg/dL (ref 2.3–4.6)

## 2012-05-19 LAB — CBC
Hemoglobin: 12.6 g/dL (ref 12.0–15.0)
MCHC: 33.6 g/dL (ref 30.0–36.0)
MCV: 91.5 fl (ref 78.0–100.0)
Platelets: 192 10*3/uL (ref 150.0–400.0)
RBC: 4.1 Mil/uL (ref 3.87–5.11)

## 2012-05-19 LAB — HEPATIC FUNCTION PANEL
ALT: 18 U/L (ref 0–35)
Albumin: 3.9 g/dL (ref 3.5–5.2)
Total Protein: 6.9 g/dL (ref 6.0–8.3)

## 2012-05-20 MED ORDER — SIMVASTATIN 10 MG PO TABS: 10.0000 mg | ORAL_TABLET | Freq: Every day | ORAL | Status: DC

## 2012-05-20 NOTE — Progress Notes (Signed)
Quick Note:  Patient Informed and voiced understanding. Per MD Simvastatin in supposed to be 10 mg. I will send RX to pharmacy ______

## 2012-07-07 ENCOUNTER — Ambulatory Visit: Payer: BC Managed Care – PPO | Admitting: Family Medicine

## 2012-07-24 IMAGING — CR DG KNEE 1-2V*L*
2 series · 2 of 2 positions shown · non-contrast
Comparison: None.

CLINICAL DATA: History of chronic left knee pain and soreness with
no known injury

LEFT KNEE - 1-2 VIEW

[t knee ap left]
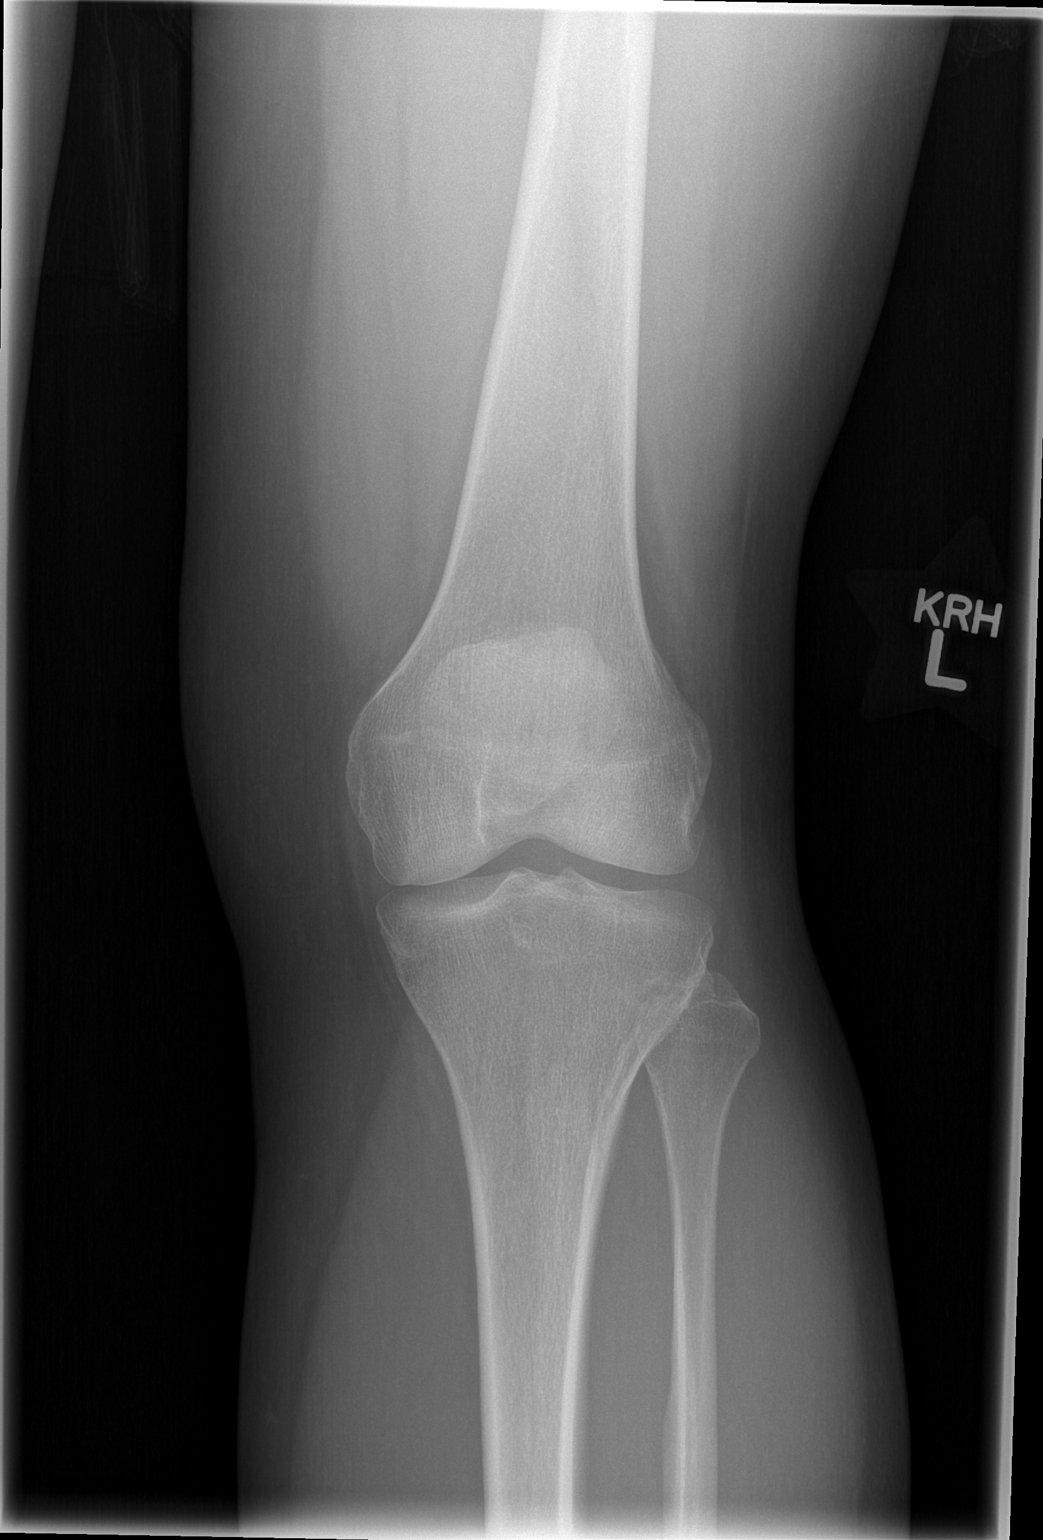

[t knee lat left]
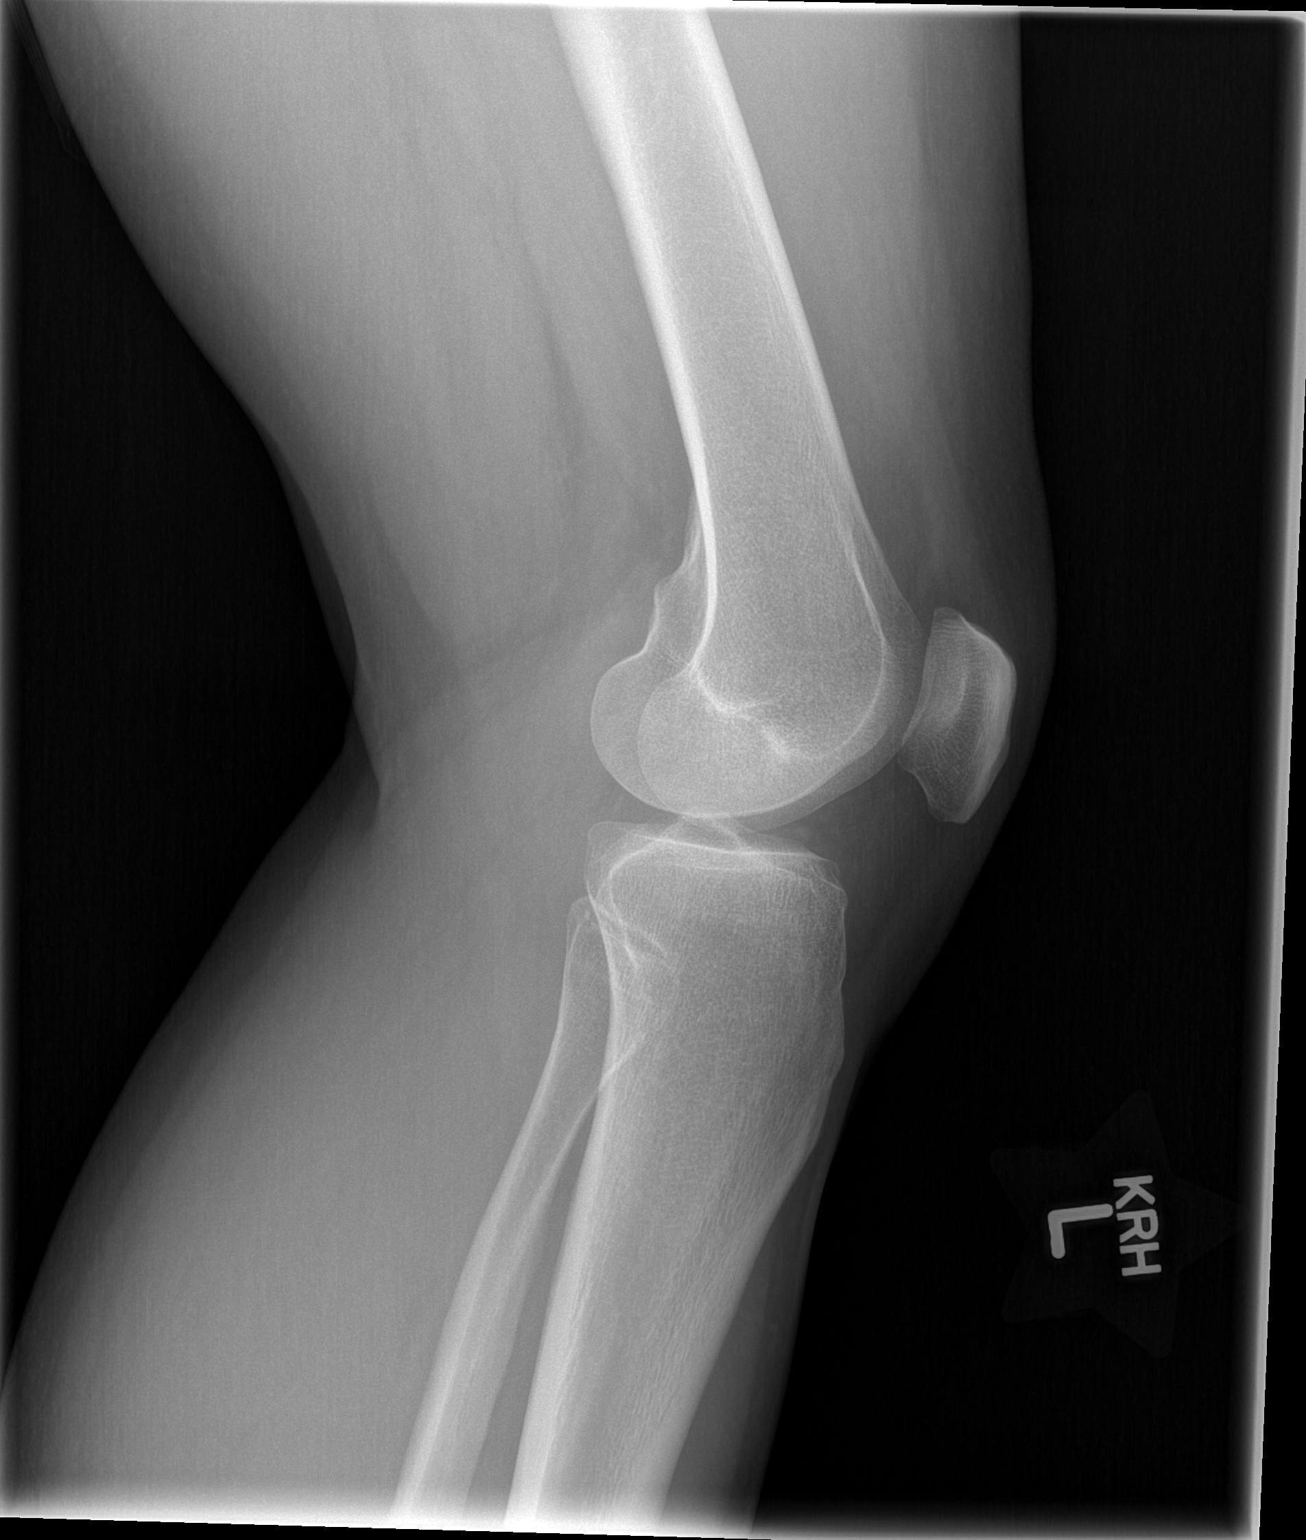

[2 of 2 positions shown; findings below may reference images not displayed]

FINDINGS: No joint effusion is evident. Alignment is normal.  Joint
spaces are preserved.  No fracture or dislocation is evident.  No
soft tissue lesions are seen.  No chondrocalcinosis or loose body
is evident.
IMPRESSION: No left knee lesion is evident.

## 2012-07-31 ENCOUNTER — Other Ambulatory Visit: Payer: Self-pay

## 2012-07-31 MED ORDER — DEXLANSOPRAZOLE 60 MG PO CPDR: 60.0000 mg | DELAYED_RELEASE_CAPSULE | Freq: Every day | ORAL | Status: DC

## 2012-08-04 ENCOUNTER — Other Ambulatory Visit: Payer: Self-pay | Admitting: Family Medicine

## 2012-08-26 ENCOUNTER — Other Ambulatory Visit: Payer: Self-pay | Admitting: Family Medicine

## 2012-09-02 ENCOUNTER — Other Ambulatory Visit: Payer: Self-pay | Admitting: Family Medicine

## 2012-09-21 ENCOUNTER — Other Ambulatory Visit (INDEPENDENT_AMBULATORY_CARE_PROVIDER_SITE_OTHER): Payer: BC Managed Care – PPO

## 2012-09-21 DIAGNOSIS — Z0000 Encounter for general adult medical examination without abnormal findings: Secondary | ICD-10-CM

## 2012-09-21 LAB — CBC
HCT: 35.7 % — ABNORMAL LOW (ref 36.0–46.0)
Hemoglobin: 12.1 g/dL (ref 12.0–15.0)
RBC: 3.94 Mil/uL (ref 3.87–5.11)
RDW: 12.8 % (ref 11.5–14.6)
WBC: 5.4 10*3/uL (ref 4.5–10.5)

## 2012-09-21 NOTE — Progress Notes (Signed)
Labs only

## 2012-09-22 LAB — RENAL FUNCTION PANEL
CO2: 26 mEq/L (ref 19–32)
Chloride: 104 mEq/L (ref 96–112)
Creatinine, Ser: 0.8 mg/dL (ref 0.4–1.2)
GFR: 86.41 mL/min (ref >60.00)
Potassium: 3.8 mEq/L (ref 3.5–5.1)
Sodium: 138 mEq/L (ref 135–145)

## 2012-09-22 LAB — TSH: TSH: 1.4 u[IU]/mL (ref 0.35–5.50)

## 2012-09-22 LAB — HEPATIC FUNCTION PANEL
AST: 21 U/L (ref 0–37)
Alkaline Phosphatase: 35 U/L — ABNORMAL LOW (ref 39–117)
Bilirubin, Direct: 0.1 mg/dL (ref 0.0–0.3)
Total Bilirubin: 0.5 mg/dL (ref 0.3–1.2)

## 2012-09-22 LAB — LIPID PANEL: Total CHOL/HDL Ratio: 4

## 2012-09-22 LAB — LDL CHOLESTEROL, DIRECT: Direct LDL: 79.4 mg/dL

## 2012-09-23 ENCOUNTER — Encounter: Payer: BC Managed Care – PPO | Admitting: Family Medicine

## 2012-09-25 ENCOUNTER — Encounter: Payer: Self-pay | Admitting: Family Medicine

## 2012-09-25 ENCOUNTER — Ambulatory Visit (INDEPENDENT_AMBULATORY_CARE_PROVIDER_SITE_OTHER): Payer: BC Managed Care – PPO | Admitting: Family Medicine

## 2012-09-25 VITALS — BP 128/84 | HR 92 | Temp 98.6°F | Ht 63.0 in | Wt 172.0 lb

## 2012-09-25 DIAGNOSIS — N3090 Cystitis, unspecified without hematuria: Secondary | ICD-10-CM

## 2012-09-25 DIAGNOSIS — F411 Generalized anxiety disorder: Secondary | ICD-10-CM

## 2012-09-25 DIAGNOSIS — R030 Elevated blood-pressure reading, without diagnosis of hypertension: Secondary | ICD-10-CM

## 2012-09-25 DIAGNOSIS — E669 Obesity, unspecified: Secondary | ICD-10-CM

## 2012-09-25 DIAGNOSIS — N3010 Interstitial cystitis (chronic) without hematuria: Secondary | ICD-10-CM

## 2012-09-25 DIAGNOSIS — E785 Hyperlipidemia, unspecified: Secondary | ICD-10-CM

## 2012-09-25 DIAGNOSIS — G4700 Insomnia, unspecified: Secondary | ICD-10-CM

## 2012-09-25 DIAGNOSIS — Z0000 Encounter for general adult medical examination without abnormal findings: Secondary | ICD-10-CM

## 2012-09-25 DIAGNOSIS — Z5189 Encounter for other specified aftercare: Secondary | ICD-10-CM

## 2012-09-25 DIAGNOSIS — T7840XD Allergy, unspecified, subsequent encounter: Secondary | ICD-10-CM

## 2012-09-25 LAB — POCT URINALYSIS DIPSTICK
Bilirubin, UA: NEGATIVE
Glucose, UA: NEGATIVE
Ketones, UA: NEGATIVE
Leukocytes, UA: NEGATIVE
Spec Grav, UA: 1.02

## 2012-09-25 MED ORDER — PHENTERMINE HCL 15 MG PO CAPS: 15.0000 mg | ORAL_CAPSULE | ORAL | Status: DC

## 2012-09-25 MED ORDER — TRAZODONE HCL 50 MG PO TABS: 50.0000 mg | ORAL_TABLET | Freq: Every day | ORAL | Status: DC

## 2012-09-25 MED ORDER — PENTOSAN POLYSULFATE SODIUM 100 MG PO CAPS: 100.0000 mg | ORAL_CAPSULE | Freq: Three times a day (TID) | ORAL | Status: DC | PRN

## 2012-09-25 MED ORDER — URELLE 81 MG PO TABS: 1.0000 | ORAL_TABLET | Freq: Three times a day (TID) | ORAL | Status: DC | PRN

## 2012-09-25 MED ORDER — ALPRAZOLAM 0.25 MG PO TABS: ORAL_TABLET | ORAL | Status: DC

## 2012-09-25 MED ORDER — LOVASTATIN 10 MG PO TABS: 10.0000 mg | ORAL_TABLET | Freq: Every day | ORAL | Status: DC

## 2012-09-25 NOTE — Patient Instructions (Addendum)

## 2012-09-26 ENCOUNTER — Encounter: Payer: Self-pay | Admitting: Family Medicine

## 2012-09-26 DIAGNOSIS — G4700 Insomnia, unspecified: Secondary | ICD-10-CM

## 2012-09-26 HISTORY — DX: Insomnia, unspecified: G47.00

## 2012-09-26 NOTE — Assessment & Plan Note (Signed)
Quit Simvastatin in December after roughly a month of use. After long discussion risks and benefits patient does agree to start a second statin. We'll try lovastatin 10 mg daily if she has any difficulty with this then would proceed with Crestor. Avoid transplants. Continue mega red caps daily. Increase exercise and minimize simple carbs and saturated fats

## 2012-09-26 NOTE — Assessment & Plan Note (Signed)
Improved only check no changes. Minimize caffeine and sodium.

## 2012-09-26 NOTE — Assessment & Plan Note (Signed)
Good response to trazodone we'll continue the same for now

## 2012-09-26 NOTE — Assessment & Plan Note (Signed)
Patient has been stable for over 5 years and is requesting that we take over her prescriptions for a long 42-year-old. She's given refills on these today and we will maintain him as long as her symptoms remain stable. We'll refer back to urology as needed.

## 2012-09-26 NOTE — Assessment & Plan Note (Signed)
Encouraged probiotics and use the Flonase.. Zyrtec when necessary and report worsening symptoms

## 2012-09-26 NOTE — Progress Notes (Signed)
Patient ID: KIHANNA KAMIYA, female   DOB: Feb 05, 1971, 42 y.o.   MRN: [id number] CYANNE DELMAR [id number] 1971-02-16 09/26/2012      Progress Note New Patient  Subjective  Chief Complaint  Chief Complaint  Patient presents with  . Annual Exam    physical    HPI  Patient is a 42 year old Caucasian female who is in today for annual exam. Overall she is doing well. Does believe trazodone is helping her sleep better. No significant trouble with headaches recently. Her cystitis is well-controlled and she's asking that we take over her medications. No acute illness. No fevers or chills. No chest pain no palpitations, shortness of breath, GI or GU concerns at this time. Does know when she tried simvastatin she had diffuse edema and some fatigue so she stopped it. The symptoms seemed to improve when she stopped the medication. She has made significant dietary and exercise changes. Is exercising regularly and avoiding transplant.  Past Medical History  Diagnosis Date  . Allergy     seasonal  . Anxiety   . Migraines   . Overweight 09/28/2010  . Other and unspecified ovarian cyst 09/28/2010  . MIGRAINE HEADACHE 09/28/2010  . 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 HYPERTRIGLYCERIDEMIA 09/28/2010  . GERD 09/28/2010  . CHEST PAIN, ATYPICAL 09/28/2010  . ANXIETY DISORDER 09/28/2010  . Anxiety disorder 11/02/2010  . Knee pain, left 12/12/2010  . Sinusitis acute 12/12/2010  . Environmental allergies 12/12/2010  . Vaginitis and vulvovaginitis 12/24/2010  . Acute epigastric pain 04/15/2011  . Hyperlipidemia 05/01/2011  . Allergic state 10/24/2011  . Elevated BP 10/24/2011  . Otitis media 05/09/2012  . Insomnia 09/26/2012    Past Surgical History  Procedure Laterality Date  . Abdominal hysterectomy  2008    total  . Back surgery  2009    L5 fracture required pins, rod, fusion L4-L5 fusion  . Elbow surgery  2011    carpal tunnel release on right  . Cesarean section  1998, 2003    Family History   Problem Relation Age of Onset  . Hypertension Mother   . Hyperlipidemia Father   . Diabetes Father     type 2  . Cancer Maternal Grandmother     Lung/ previous smoker  . Hypertension Maternal Grandmother   . Hyperlipidemia Maternal Grandmother   . Other Maternal Grandfather     renal failure  . Heart disease Maternal Grandfather   . Hypertension Maternal Grandfather   . Hyperlipidemia Maternal Grandfather   . Colon polyps Maternal Grandfather   . Diabetes Paternal Grandmother   . Hypertension Paternal Grandmother   . Hyperlipidemia Paternal Grandmother   . Hypertension Paternal Grandfather   . Hyperlipidemia Paternal Grandfather   . Cancer Paternal Grandfather     skin  . Depression Brother   . Obesity Son   . Heart attack Paternal Aunt     smoker  . Heart attack Paternal Uncle     smoker    History   Social History  . Marital Status: Married    Spouse Name: N/A    Number of Children: N/A  . Years of Education: N/A   Occupational History  . Not on file.   Social History Main Topics  . Smoking status: Never Smoker   . Smokeless tobacco: Never Used  . Alcohol Use: Yes     Comment: rare, special occasions  . Drug Use: No  . Sexually Active: Yes -- Female partner(s)   Other Topics Concern  .  Not on file   Social History Narrative  . No narrative on file    Current Outpatient Prescriptions on File Prior to Visit  Medication Sig Dispense Refill  . cetirizine (ZYRTEC) 10 MG tablet 1 tab twice daily as needed for congestion  30 tablet  11  . DEXILANT 60 MG capsule TAKE 1 CAPSULE BY MOUTH EVERY DAY  30 capsule  5  . fluticasone (FLONASE) 50 MCG/ACT nasal spray Place 2 sprays into the nose daily as needed for rhinitis or allergies.  16 g  6  . PREMARIN 1.25 MG tablet TAKE 1 TABLET (1.25 MG TOTAL) BY MOUTH DAILY.  30 tablet  3  . Probiotic Product (PRO-FLORA CONCENTRATE) CAPS Take 1 capsule by mouth daily.        . SUMAtriptan (IMITREX) 100 MG tablet Take 100 mg by  mouth every 2 (two) hours as needed.        . fluconazole (DIFLUCAN) 150 MG tablet Take 1 tab po q week as directed  4 tablet  2   No current facility-administered medications on file prior to visit.    Allergies  Allergen Reactions  . Sulfonamide Derivatives     REACTION: hives    Review of Systems  Review of Systems  Constitutional: Negative for fever, chills and malaise/fatigue.  HENT: Negative for hearing loss, nosebleeds and congestion.   Eyes: Negative for discharge.  Respiratory: Negative for cough, sputum production, shortness of breath and wheezing.   Cardiovascular: Negative for chest pain, palpitations and leg swelling.  Gastrointestinal: Negative for heartburn, nausea, vomiting, abdominal pain, diarrhea, constipation and blood in stool.  Genitourinary: Negative for dysuria, urgency, frequency and hematuria.  Musculoskeletal: Negative for myalgias, back pain and falls.  Skin: Negative for rash.  Neurological: Negative for dizziness, tremors, sensory change, focal weakness, loss of consciousness, weakness and headaches.  Endo/Heme/Allergies: Negative for polydipsia. Does not bruise/bleed easily.  Psychiatric/Behavioral: Negative for depression and suicidal ideas. The patient is not nervous/anxious and does not have insomnia.      Objective  BP 128/84  Pulse 92  Temp(Src) 98.6 F (37 C) (Oral)  Ht 5\' 3"  (1.6 m)  Wt 172 lb (78.019 kg)  BMI 30.48 kg/m2  SpO2 99%  Physical Exam  Physical Exam  Constitutional: She is oriented to person, place, and time and well-developed, well-nourished, and in no distress. No distress.  HENT:  Head: Normocephalic and atraumatic.  Right Ear: External ear normal.  Left Ear: External ear normal.  Nose: Nose normal.  Mouth/Throat: Oropharynx is clear and moist. No oropharyngeal exudate.  Eyes: Conjunctivae are normal. Pupils are equal, round, and reactive to light. Right eye exhibits no discharge. Left eye exhibits no discharge.  No scleral icterus.  Neck: Normal range of motion. Neck supple. No thyromegaly present.  Cardiovascular: Normal rate, regular rhythm, normal heart sounds and intact distal pulses.   No murmur heard. Pulmonary/Chest: Effort normal and breath sounds normal. No respiratory distress. She has no wheezes. She has no rales.  Abdominal: Soft. Bowel sounds are normal. She exhibits no distension and no mass. There is no tenderness.  Genitourinary:  Breast exam unremarkable. No masses, lesions, skin changes or discharge  Musculoskeletal: Normal range of motion. She exhibits no edema and no tenderness.  Lymphadenopathy:    She has no cervical adenopathy.  Neurological: She is alert and oriented to person, place, and time. She has normal reflexes. No cranial nerve deficit. Coordination normal.  Skin: Skin is warm and dry. No rash noted. She  is not diaphoretic.  Psychiatric: Mood, memory and affect normal.       Assessment & Plan  Preventative health care Ordered screening MGM today. Patient does complain of some chest pain but has no remarkable findings on her exam. We'll proceed with screening mammogram at this time. Encouraged heart healthy diet, regular exercise, adequate sleep.  Hyperlipidemia Quit Simvastatin in December after roughly a month of use. After long discussion risks and benefits patient does agree to start a second statin. We'll try lovastatin 10 mg daily if she has any difficulty with this then would proceed with Crestor. Avoid transplants. Continue mega red caps daily. Increase exercise and minimize simple carbs and saturated fats  Elevated BP Improved only check no changes. Minimize caffeine and sodium.  INTERSTITIAL CYSTITIS Patient has been stable for over 5 years and is requesting that we take over her prescriptions for a long 58-year-old. She's given refills on these today and we will maintain him as long as her symptoms remain stable. We'll refer back to urology as  needed.  Allergic state Encouraged probiotics and use the Flonase.. Zyrtec when necessary and report worsening symptoms  Insomnia Good response to trazodone we'll continue the same for now

## 2012-09-26 NOTE — Assessment & Plan Note (Addendum)
Ordered screening MGM today. Patient does complain of some chest pain but has no remarkable findings on her exam. We'll proceed with screening mammogram at this time. Encouraged heart healthy diet, regular exercise, adequate sleep.

## 2012-09-28 ENCOUNTER — Other Ambulatory Visit: Payer: Self-pay

## 2012-09-28 DIAGNOSIS — Z1231 Encounter for screening mammogram for malignant neoplasm of breast: Secondary | ICD-10-CM

## 2012-10-23 ENCOUNTER — Ambulatory Visit: Payer: BC Managed Care – PPO | Admitting: Family Medicine

## 2012-10-23 ENCOUNTER — Ambulatory Visit: Payer: BC Managed Care – PPO

## 2012-10-26 ENCOUNTER — Other Ambulatory Visit: Payer: Self-pay | Admitting: Family Medicine

## 2012-10-26 ENCOUNTER — Telehealth: Payer: Self-pay

## 2012-10-26 DIAGNOSIS — B379 Candidiasis, unspecified: Secondary | ICD-10-CM

## 2012-10-26 DIAGNOSIS — E669 Obesity, unspecified: Secondary | ICD-10-CM

## 2012-10-26 MED ORDER — PHENTERMINE HCL 15 MG PO CAPS: 15.0000 mg | ORAL_CAPSULE | ORAL | Status: DC

## 2012-10-26 MED ORDER — METRONIDAZOLE 500 MG PO TABS: 2000.0000 mg | ORAL_TABLET | Freq: Once | ORAL | Status: DC

## 2012-10-26 NOTE — Telephone Encounter (Signed)
Pt called back stating she has a couple of Flagyl on hand and believes she has a bacterial infection? Pt states she would just like the message to go to doctor Abner Greenspan because these 2 medications are just something the patient keeps on hand. Pt is aware that md is out of the office until Wed

## 2012-10-26 NOTE — Telephone Encounter (Signed)
I sent the flagyl to the pharmacy. Do you want the Diflucan sent also?

## 2012-10-26 NOTE — Telephone Encounter (Signed)
RX sent

## 2012-10-26 NOTE — Telephone Encounter (Signed)
She can have a one month refill of the Phentermine 15 mg daily but no more til I see her and check her bp and pulse. Last month was her first prescription

## 2012-10-26 NOTE — Telephone Encounter (Signed)
Pt left a message stating that she would like to have a refill on her Diflucan and Flagyl? I left a message for patient to return my call. Need to know if patient is having any symptoms and if so what symptoms.

## 2012-10-26 NOTE — Telephone Encounter (Signed)
Please advise this refill.

## 2012-10-26 NOTE — Telephone Encounter (Signed)
So I do not know how many flagyl she has but if she has 4 a single dose of 2 grams po at once is a good treatment. Flagy 500 mg tabs 4 tabs po once.

## 2012-10-27 NOTE — Telephone Encounter (Signed)
She can have Diflucan 150 mg tabs 1 tab po q week x 2 weeks, disp #2

## 2012-10-28 MED ORDER — FLUCONAZOLE 150 MG PO TABS: ORAL_TABLET | ORAL | Status: DC

## 2012-11-03 ENCOUNTER — Ambulatory Visit (INDEPENDENT_AMBULATORY_CARE_PROVIDER_SITE_OTHER): Payer: BC Managed Care – PPO

## 2012-11-03 DIAGNOSIS — Z1231 Encounter for screening mammogram for malignant neoplasm of breast: Secondary | ICD-10-CM

## 2012-11-07 ENCOUNTER — Other Ambulatory Visit: Payer: Self-pay | Admitting: Family Medicine

## 2012-11-09 ENCOUNTER — Other Ambulatory Visit: Payer: Self-pay | Admitting: Family Medicine

## 2012-11-09 NOTE — Telephone Encounter (Signed)
Please advise.....  Trazodone (desyrel) 50 mg Take 1 tablet by mouth at bedtime #30 [id number]

## 2012-11-09 NOTE — Telephone Encounter (Signed)
Trazodone was sent to pharmacy on 11/07/2012

## 2012-11-15 ENCOUNTER — Encounter (HOSPITAL_BASED_OUTPATIENT_CLINIC_OR_DEPARTMENT_OTHER): Payer: Self-pay | Admitting: *Deleted

## 2012-11-15 ENCOUNTER — Emergency Department (HOSPITAL_BASED_OUTPATIENT_CLINIC_OR_DEPARTMENT_OTHER): Payer: BC Managed Care – PPO

## 2012-11-15 ENCOUNTER — Emergency Department (HOSPITAL_BASED_OUTPATIENT_CLINIC_OR_DEPARTMENT_OTHER)
Admission: EM | Admit: 2012-11-15 | Discharge: 2012-11-15 | Disposition: A | Discharge status: Home / Self Care | Payer: BC Managed Care – PPO | Attending: Emergency Medicine | Admitting: Emergency Medicine

## 2012-11-15 DIAGNOSIS — Z8639 Personal history of other endocrine, nutritional and metabolic disease: Secondary | ICD-10-CM | POA: Insufficient documentation

## 2012-11-15 DIAGNOSIS — Z8739 Personal history of other diseases of the musculoskeletal system and connective tissue: Secondary | ICD-10-CM | POA: Insufficient documentation

## 2012-11-15 DIAGNOSIS — G4700 Insomnia, unspecified: Secondary | ICD-10-CM | POA: Insufficient documentation

## 2012-11-15 DIAGNOSIS — R1310 Dysphagia, unspecified: Secondary | ICD-10-CM | POA: Insufficient documentation

## 2012-11-15 DIAGNOSIS — K219 Gastro-esophageal reflux disease without esophagitis: Secondary | ICD-10-CM | POA: Insufficient documentation

## 2012-11-15 DIAGNOSIS — E785 Hyperlipidemia, unspecified: Secondary | ICD-10-CM | POA: Insufficient documentation

## 2012-11-15 DIAGNOSIS — G43909 Migraine, unspecified, not intractable, without status migrainosus: Secondary | ICD-10-CM | POA: Insufficient documentation

## 2012-11-15 DIAGNOSIS — Z862 Personal history of diseases of the blood and blood-forming organs and certain disorders involving the immune mechanism: Secondary | ICD-10-CM | POA: Insufficient documentation

## 2012-11-15 DIAGNOSIS — Z8679 Personal history of other diseases of the circulatory system: Secondary | ICD-10-CM | POA: Insufficient documentation

## 2012-11-15 DIAGNOSIS — Z8742 Personal history of other diseases of the female genital tract: Secondary | ICD-10-CM | POA: Insufficient documentation

## 2012-11-15 DIAGNOSIS — Z87448 Personal history of other diseases of urinary system: Secondary | ICD-10-CM | POA: Insufficient documentation

## 2012-11-15 DIAGNOSIS — Z8669 Personal history of other diseases of the nervous system and sense organs: Secondary | ICD-10-CM | POA: Insufficient documentation

## 2012-11-15 DIAGNOSIS — R110 Nausea: Secondary | ICD-10-CM | POA: Insufficient documentation

## 2012-11-15 DIAGNOSIS — R000 Tachycardia, unspecified: Secondary | ICD-10-CM | POA: Insufficient documentation

## 2012-11-15 DIAGNOSIS — E663 Overweight: Secondary | ICD-10-CM | POA: Insufficient documentation

## 2012-11-15 DIAGNOSIS — K2970 Gastritis, unspecified, without bleeding: Secondary | ICD-10-CM | POA: Insufficient documentation

## 2012-11-15 DIAGNOSIS — Z90710 Acquired absence of both cervix and uterus: Secondary | ICD-10-CM | POA: Insufficient documentation

## 2012-11-15 DIAGNOSIS — Z8719 Personal history of other diseases of the digestive system: Secondary | ICD-10-CM | POA: Insufficient documentation

## 2012-11-15 DIAGNOSIS — Z792 Long term (current) use of antibiotics: Secondary | ICD-10-CM | POA: Insufficient documentation

## 2012-11-15 DIAGNOSIS — Z8709 Personal history of other diseases of the respiratory system: Secondary | ICD-10-CM | POA: Insufficient documentation

## 2012-11-15 DIAGNOSIS — F411 Generalized anxiety disorder: Secondary | ICD-10-CM | POA: Insufficient documentation

## 2012-11-15 DIAGNOSIS — K2990 Gastroduodenitis, unspecified, without bleeding: Secondary | ICD-10-CM | POA: Insufficient documentation

## 2012-11-15 DIAGNOSIS — K824 Cholesterolosis of gallbladder: Secondary | ICD-10-CM | POA: Insufficient documentation

## 2012-11-15 DIAGNOSIS — Z79899 Other long term (current) drug therapy: Secondary | ICD-10-CM | POA: Insufficient documentation

## 2012-11-15 LAB — URINALYSIS, ROUTINE W REFLEX MICROSCOPIC
Glucose, UA: NEGATIVE mg/dL
Ketones, ur: NEGATIVE mg/dL
Leukocytes, UA: NEGATIVE
Protein, ur: NEGATIVE mg/dL

## 2012-11-15 LAB — CBC WITH DIFFERENTIAL/PLATELET
Basophils Absolute: 0 10*3/uL (ref 0.0–0.1)
Basophils Relative: 0 % (ref 0–1)
Eosinophils Absolute: 0.1 10*3/uL (ref 0.0–0.7)
Eosinophils Relative: 1 % (ref 0–5)
HCT: 37.8 % (ref 36.0–46.0)
MCH: 31.6 pg (ref 26.0–34.0)
MCHC: 35.7 g/dL (ref 30.0–36.0)
MCV: 88.5 fL (ref 78.0–100.0)
Monocytes Absolute: 0.6 10*3/uL (ref 0.1–1.0)
Neutro Abs: 4.5 10*3/uL (ref 1.7–7.7)
RDW: 11.9 % (ref 11.5–15.5)

## 2012-11-15 LAB — COMPREHENSIVE METABOLIC PANEL
AST: 15 U/L (ref 0–37)
Albumin: 3.6 g/dL (ref 3.5–5.2)
Calcium: 9.3 mg/dL (ref 8.4–10.5)
Creatinine, Ser: 0.9 mg/dL (ref 0.50–1.10)
Total Protein: 6.8 g/dL (ref 6.0–8.3)

## 2012-11-15 MED ORDER — MORPHINE SULFATE 4 MG/ML IJ SOLN
4.0000 mg | Freq: Once | INTRAMUSCULAR | Status: AC
  Administered 2012-11-15: 4 mg via INTRAVENOUS
  Filled 2012-11-15: qty 1

## 2012-11-15 MED ORDER — FAMOTIDINE 20 MG PO TABS: 20.0000 mg | ORAL_TABLET | Freq: Two times a day (BID) | ORAL | Status: DC

## 2012-11-15 MED ORDER — SUCRALFATE 1 GM/10ML PO SUSP: 1.0000 g | Freq: Four times a day (QID) | ORAL | Status: DC

## 2012-11-15 MED ORDER — SODIUM CHLORIDE 0.9 % IV BOLUS (SEPSIS)
1000.0000 mL | Freq: Once | INTRAVENOUS | Status: AC
  Administered 2012-11-15: 1000 mL via INTRAVENOUS

## 2012-11-15 MED ORDER — FAMOTIDINE IN NACL 20-0.9 MG/50ML-% IV SOLN
20.0000 mg | Freq: Once | INTRAVENOUS | Status: AC
  Administered 2012-11-15: 20 mg via INTRAVENOUS
  Filled 2012-11-15: qty 50

## 2012-11-15 MED ORDER — ONDANSETRON HCL 4 MG PO TABS: 4.0000 mg | ORAL_TABLET | Freq: Four times a day (QID) | ORAL | Status: DC

## 2012-11-15 MED ORDER — ONDANSETRON HCL 4 MG/2ML IJ SOLN
4.0000 mg | Freq: Once | INTRAMUSCULAR | Status: AC
  Administered 2012-11-15: 4 mg via INTRAVENOUS
  Filled 2012-11-15: qty 2

## 2012-11-15 NOTE — ED Notes (Signed)
MD at bedside. 

## 2012-11-15 NOTE — ED Provider Notes (Addendum)
History    This chart was scribed for Brittney Sprout, MD by Melba Coon, ED Scribe. The patient was seen in room MH10/MH10 and the patient's care was started at 5:27PM.    CSN: [id number]  Arrival date & time 11/15/12  1637   First MD Initiated Contact with Patient 11/15/12 1707      Chief Complaint  Patient presents with  . Abdominal Pain    (Consider location/radiation/quality/duration/timing/severity/associated sxs/prior treatment) The history is provided by the patient. No language interpreter was used.   Brittney Townsend is a 42 y.o. female who presents to the Emergency Department complaining of constant, moderate to severe epigastric abdominal pain with nausea with an onset this morning. She reports she has had decreased appetite this past week. The pain this morning was so severe that it woke her from sleep. She also reports the pain radiates into her back between her shoulders. She reports dysphagia due to history of GERD and increased burping since 2-3 days ago. She reports some heat intolerance but no documented fever. She reports she takes dramamine which has not alleviated her nausea. She reports white loose stools. Denies HA, fever, neck pain, sore throat, rash, back pain, CP, SOB, dysuria, or extremity pain, edema, weakness, numbness, or tingling.She reports a hysterectomy but no other surgeries. She has taken Dexilant for the past 2 years. She denies new medication within the past month. She does not overtake ibuprofen or ASA. She takes Urised and has had green colored urine. No other pertinent medical symptoms. She denies smoking or excessive drinking.  Past Medical History  Diagnosis Date  . Allergy     seasonal  . Anxiety   . Migraines   . Overweight 09/28/2010  . Other and unspecified ovarian cyst 09/28/2010  . MIGRAINE HEADACHE 09/28/2010  . 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 HYPERTRIGLYCERIDEMIA 09/28/2010  . GERD 09/28/2010  . CHEST PAIN, ATYPICAL 09/28/2010  .  ANXIETY DISORDER 09/28/2010  . Anxiety disorder 11/02/2010  . Knee pain, left 12/12/2010  . Sinusitis acute 12/12/2010  . Environmental allergies 12/12/2010  . Vaginitis and vulvovaginitis 12/24/2010  . Acute epigastric pain 04/15/2011  . Hyperlipidemia 05/01/2011  . Allergic state 10/24/2011  . Elevated BP 10/24/2011  . Otitis media 05/09/2012  . Insomnia 09/26/2012    Past Surgical History  Procedure Laterality Date  . Abdominal hysterectomy  2008    total  . Back surgery  2009    L5 fracture required pins, rod, fusion L4-L5 fusion  . Elbow surgery  2011    carpal tunnel release on right  . Cesarean section  1998, 2003    Family History  Problem Relation Age of Onset  . Hypertension Mother   . Hyperlipidemia Father   . Diabetes Father     type 2  . Cancer Maternal Grandmother     Lung/ previous smoker  . Hypertension Maternal Grandmother   . Hyperlipidemia Maternal Grandmother   . Other Maternal Grandfather     renal failure  . Heart disease Maternal Grandfather   . Hypertension Maternal Grandfather   . Hyperlipidemia Maternal Grandfather   . Colon polyps Maternal Grandfather   . Diabetes Paternal Grandmother   . Hypertension Paternal Grandmother   . Hyperlipidemia Paternal Grandmother   . Hypertension Paternal Grandfather   . Hyperlipidemia Paternal Grandfather   . Cancer Paternal Grandfather     skin  . Depression Brother   . Obesity Son   . Heart attack Paternal Aunt     smoker  .  Heart attack Paternal Uncle     smoker    History  Substance Use Topics  . Smoking status: Never Smoker   . Smokeless tobacco: Never Used  . Alcohol Use: Yes     Comment: rare, special occasions    OB History   Grav Para Term Preterm Abortions TAB SAB Ect Mult Living                  Review of Systems  Gastrointestinal: Positive for abdominal pain.   10 Systems reviewed and all are negative for acute change except as noted in the HPI.   Allergies  Sulfonamide  derivatives  Home Medications   Current Outpatient Rx  Name  Route  Sig  Dispense  Refill  . ALPRAZolam (XANAX) 0.25 MG tablet      1/2 to 1 tablet by mouth twice daily as needed.   60 tablet   2   . cetirizine (ZYRTEC) 10 MG tablet      1 tab twice daily as needed for congestion   30 tablet   11   . DEXILANT 60 MG capsule      TAKE 1 CAPSULE BY MOUTH EVERY DAY   30 capsule   5   . fluconazole (DIFLUCAN) 150 MG tablet      Take 1 tab po q week as directed   2 tablet   2   . EXPIRED: fluticasone (FLONASE) 50 MCG/ACT nasal spray   Nasal   Place 2 sprays into the nose daily as needed for rhinitis or allergies.   16 g   6   . lovastatin (MEVACOR) 10 MG tablet   Oral   Take 1 tablet (10 mg total) by mouth at bedtime.   30 tablet   3   . metroNIDAZOLE (FLAGYL) 500 MG tablet   Oral   Take 4 tablets (2,000 mg total) by mouth once.   4 tablet   0   . Multiple Vitamin (MULTIVITAMIN) tablet   Oral   Take 1 tablet by mouth daily.         . pentosan polysulfate (ELMIRON) 100 MG capsule   Oral   Take 1 capsule (100 mg total) by mouth 3 (three) times daily as needed.   90 capsule   5   . phentermine 15 MG capsule   Oral   Take 1 capsule (15 mg total) by mouth every morning.   30 capsule   0   . PREMARIN 1.25 MG tablet      TAKE 1 TABLET (1.25 MG TOTAL) BY MOUTH DAILY.   30 tablet   3   . Probiotic Product (PRO-FLORA CONCENTRATE) CAPS   Oral   Take 1 capsule by mouth daily.           . SUMAtriptan (IMITREX) 100 MG tablet   Oral   Take 100 mg by mouth every 2 (two) hours as needed.           Kristin Bruins BIOTIN PO   Oral   Take by mouth.         . traZODone (DESYREL) 50 MG tablet   Oral   Take 1 tablet (50 mg total) by mouth at bedtime.   30 tablet   5   . traZODone (DESYREL) 50 MG tablet      TAKE 1 TABLET (50 MG TOTAL) BY MOUTH AT BEDTIME.   30 tablet   5   . traZODone (DESYREL) 50 MG tablet  TAKE 1 TABLET (50 MG TOTAL) BY MOUTH  AT BEDTIME.   30 tablet   5   . URELLE (URELLE/URISED) 81 MG TABS   Oral   Take 1 tablet (81 mg total) by mouth 3 (three) times daily as needed.   90 each   5     BP 131/88  Pulse 98  Temp(Src) 98.3 F (36.8 C) (Oral)  Resp 16  Ht 5\' 3"  (1.6 m)  Wt 163 lb (73.936 kg)  BMI 28.88 kg/m2  SpO2 97%  Physical Exam  Nursing note and vitals reviewed. Constitutional: She is oriented to person, place, and time. She appears well-developed.  HENT:  Head: Normocephalic and atraumatic.  Mouth/Throat: No oropharyngeal exudate.  Eyes: Conjunctivae and EOM are normal. No scleral icterus.  Neck: Normal range of motion. Neck supple. No thyromegaly present.  Cardiovascular: Regular rhythm.  Tachycardia present.  Exam reveals no gallop and no friction rub.   No murmur heard. Pulmonary/Chest: Effort normal and breath sounds normal. No stridor. She has no wheezes. She has no rales. She exhibits no tenderness.  Abdominal: Soft. Bowel sounds are normal. She exhibits no distension and no mass. There is tenderness (RUQ, epigastric). There is no rebound and no guarding.  Positive Murphy's sign.  Musculoskeletal: Normal range of motion. She exhibits no edema and no tenderness.  Lymphadenopathy:    She has no cervical adenopathy.  Neurological: She is alert and oriented to person, place, and time. Coordination normal.  Skin: Skin is warm. No rash noted. No erythema.  Psychiatric: She has a normal mood and affect. Her behavior is normal.    ED Course  Procedures (including critical care time)  DIAGNOSTIC STUDIES: Oxygen Saturation is 97% on room air, normal by my interpretation.    COORDINATION OF CARE:  5:34PM - Zofran, morphine, famotidine, abdominal ultrasound, CBC with differential, CMP, lipase, UA, and EKG will be ordered for Thalia Bloodgood.     Date: 11/15/2012  Rate: 80  Rhythm: normal sinus rhythm  QRS Axis: normal  Intervals: normal  ST/T Wave abnormalities: normal  Conduction  Disutrbances: none  Narrative Interpretation: unremarkable      Labs Reviewed  URINALYSIS, ROUTINE W REFLEX MICROSCOPIC - Abnormal; Notable for the following:    Color, Urine GREEN (*)    All other components within normal limits  COMPREHENSIVE METABOLIC PANEL - Abnormal; Notable for the following:    Glucose, Bld 108 (*)    Total Bilirubin 0.2 (*)    GFR calc non Af Amer 78 (*)    All other components within normal limits  CBC WITH DIFFERENTIAL  LIPASE, BLOOD   US Abdomen Complete  11/15/2012  *RADIOLOGY REPORT*  Clinical Data:  Abdominal pain, nausea, bloating.  COMPLETE ABDOMINAL ULTRASOUND  Comparison:  Ultrasound 04/15/2011  Findings:  Gallbladder:  6 mm nonmobile nonshadowing echogenic focus in the fundus of the gallbladder, likely small polyp.  No wall thickening. Negative sonographic Murphy's.  Common bile duct:   Normal caliber, 3 mm.  Liver:  Heterogeneous, slightly increased echotexture suggesting fatty infiltration.  No focal abnormality or biliary ductal dilatation.  IVC:  Limited evaluation due to overlying bowel gas.  Visualized portions unremarkable.  Pancreas:  Limited visualization due to overlying bowel gas. Visualized portions unremarkable.  Spleen:  Within normal limits in size and echotexture.  Right Kidney:   Normal in size and parenchymal echogenicity.  No evidence of mass or hydronephrosis.  Left Kidney:  Normal in size and parenchymal echogenicity.  No evidence  of mass or hydronephrosis.  Abdominal aorta:  No aneurysm identified.  IMPRESSION: Suspect fatty infiltration of the liver.  Small gallbladder wall polyp.   Original Report Authenticated By: Charlett Nose, M.D.      No diagnosis found.    MDM   Patient with intermittent nausea, epigastric pain and right upper quadrant pain for the last few weeks but worse since Friday. Now she has anorexia severe nausea and states anytime she attempts to eat the pain worsens. On exam she does have a positive Murphy sign and  right upper quadrant tenderness as well as epigastric tenderness. She denies excessive NSAID or aspirin use and no extensive alcohol use. She does take excellent daily for the last 2 years but states it's not helping any longer.  Concern for cholecystitis versus gastritis versus peptic ulcer disease. Patient given IV fluids, pain, nausea medication and Pepcid. CBC, CMP, lipase, UA, abdominal ultrasound pending.  7:04 PM Labs are within normal limits. Ultrasound shows gallbladder wall followup but findings concerning for cholecystitis. Findings discussed with the patient and feel that most likely this is GERD related and in addition to excellent we'll have her start Pepcid for the next 2 weeks and will also have her do Carafate susp when necessary.  Will have her followup with GI if symptoms continue.  I personally performed the services described in this documentation, which was scribed in my presence.  The recorded information has been reviewed and considered.        Brittney Sprout, MD 11/15/12 1754  Brittney Sprout, MD 11/15/12 [id number]

## 2012-11-15 NOTE — ED Notes (Addendum)
Pt states she has not been able to eat a lot for the past week. This a.m. was awakened  with epigastric pain and pain between shoulders. "Indigestion and a lot of burping" "Upset stomach" EKG being done at triage.

## 2012-11-15 NOTE — Discharge Instructions (Signed)
Gastritis, Adult Gastritis is soreness and swelling (inflammation) of the lining of the stomach. Gastritis can develop as a sudden onset (acute) or long-term (chronic) condition. If gastritis is not treated, it can lead to stomach bleeding and ulcers. CAUSES  Gastritis occurs when the stomach lining is weak or damaged. Digestive juices from the stomach then inflame the weakened stomach lining. The stomach lining may be weak or damaged due to viral or bacterial infections. One common bacterial infection is the Helicobacter pylori infection. Gastritis can also result from excessive alcohol consumption, taking certain medicines, or having too much acid in the stomach.  SYMPTOMS  In some cases, there are no symptoms. When symptoms are present, they may include:  Pain or a burning sensation in the upper abdomen.  Nausea.  Vomiting.  An uncomfortable feeling of fullness after eating. DIAGNOSIS  Your caregiver may suspect you have gastritis based on your symptoms and a physical exam. To determine the cause of your gastritis, your caregiver may perform the following:  Blood or stool tests to check for the H pylori bacterium.  Gastroscopy. A thin, flexible tube (endoscope) is passed down the esophagus and into the stomach. The endoscope has a light and camera on the end. Your caregiver uses the endoscope to view the inside of the stomach.  Taking a tissue sample (biopsy) from the stomach to examine under a microscope. TREATMENT  Depending on the cause of your gastritis, medicines may be prescribed. If you have a bacterial infection, such as an H pylori infection, antibiotics may be given. If your gastritis is caused by too much acid in the stomach, H2 blockers or antacids may be given. Your caregiver may recommend that you stop taking aspirin, ibuprofen, or other nonsteroidal anti-inflammatory drugs (NSAIDs). HOME CARE INSTRUCTIONS  Only take over-the-counter or prescription medicines as directed by  your caregiver.  If you were given antibiotic medicines, take them as directed. Finish them even if you start to feel better.  Drink enough fluids to keep your urine clear or pale yellow.  Avoid foods and drinks that make your symptoms worse, such as:  Caffeine or alcoholic drinks.  Chocolate.  Peppermint or mint flavorings.  Garlic and onions.  Spicy foods.  Citrus fruits, such as oranges, lemons, or limes.  Tomato-based foods such as sauce, chili, salsa, and pizza.  Fried and fatty foods.  Eat small, frequent meals instead of large meals. SEEK IMMEDIATE MEDICAL CARE IF:   You have black or dark red stools.  You vomit blood or material that looks like coffee grounds.  You are unable to keep fluids down.  Your abdominal pain gets worse.  You have a fever.  You do not feel better after 1 week.  You have any other questions or concerns. MAKE SURE YOU:  Understand these instructions.  Will watch your condition.  Will get help right away if you are not doing well or get worse. Document Released: 06/25/2001 Document Revised: 12/31/2011 Document Reviewed: 08/14/2011 [employer] Patient Information 2013 [employer].  

## 2012-11-16 ENCOUNTER — Other Ambulatory Visit: Payer: Self-pay | Admitting: Family Medicine

## 2012-11-16 ENCOUNTER — Telehealth: Payer: Self-pay

## 2012-11-16 ENCOUNTER — Other Ambulatory Visit (INDEPENDENT_AMBULATORY_CARE_PROVIDER_SITE_OTHER): Payer: BC Managed Care – PPO

## 2012-11-16 DIAGNOSIS — R1013 Epigastric pain: Secondary | ICD-10-CM

## 2012-11-16 DIAGNOSIS — R110 Nausea: Secondary | ICD-10-CM

## 2012-11-16 DIAGNOSIS — R109 Unspecified abdominal pain: Secondary | ICD-10-CM

## 2012-11-16 DIAGNOSIS — K219 Gastro-esophageal reflux disease without esophagitis: Secondary | ICD-10-CM

## 2012-11-16 DIAGNOSIS — K824 Cholesterolosis of gallbladder: Secondary | ICD-10-CM

## 2012-11-16 NOTE — Telephone Encounter (Signed)
Pt states she started burping really bad X 1 1/2 weeks ago and nauseated. Woke up yesterday and went to the ER. EKG good and ultrasound showed a cyst on gallbladder. And stated she had gastritis. Pt was given pepcid to take along with the Dexilant, something to coat stomach,and a nausea medication. Pt states she can't eat or drink anything without burping or it bothering her stomach.   Pt stated that the ER told her that if kept bothering her to inform her MD and she can refer pt to [facility]. Pt would like to proceed with this referral. Pt would also like to know what to do about the cyst on the gallbladder? Please advise?

## 2012-11-16 NOTE — Progress Notes (Signed)
Labs only

## 2012-11-16 NOTE — Telephone Encounter (Signed)
Per md we will add an H Pylori test and put in a referral to a surgeon and GI.  Pt informed and voiced understanding to the plan. Pt will come in this week for labs

## 2012-11-17 LAB — HELICOBACTER PYLORI  ANTIBODY, IGM: Helicobacter pylori, IgM: 1.4 U/mL (ref <9.0)

## 2012-11-19 ENCOUNTER — Other Ambulatory Visit: Payer: Self-pay | Admitting: Emergency Medicine

## 2012-11-19 ENCOUNTER — Other Ambulatory Visit (INDEPENDENT_AMBULATORY_CARE_PROVIDER_SITE_OTHER): Payer: BC Managed Care – PPO

## 2012-11-19 DIAGNOSIS — R109 Unspecified abdominal pain: Secondary | ICD-10-CM

## 2012-11-19 NOTE — Progress Notes (Signed)
Labs only

## 2012-11-20 LAB — OVA AND PARASITE EXAMINATION: OP: NONE SEEN

## 2012-11-20 LAB — GIARDIA/CRYPTOSPORIDIUM (EIA): Giardia Screen (EIA): NEGATIVE

## 2012-11-23 ENCOUNTER — Encounter (HOSPITAL_BASED_OUTPATIENT_CLINIC_OR_DEPARTMENT_OTHER): Payer: Self-pay

## 2012-11-23 ENCOUNTER — Other Ambulatory Visit (HOSPITAL_BASED_OUTPATIENT_CLINIC_OR_DEPARTMENT_OTHER): Payer: BC Managed Care – PPO

## 2012-11-23 ENCOUNTER — Ambulatory Visit (HOSPITAL_BASED_OUTPATIENT_CLINIC_OR_DEPARTMENT_OTHER)
Admission: RE | Admit: 2012-11-23 | Discharge: 2012-11-23 | Disposition: A | Discharge status: Home / Self Care | Payer: BC Managed Care – PPO | Source: Ambulatory Visit | Attending: Family Medicine | Admitting: Family Medicine

## 2012-11-23 ENCOUNTER — Encounter: Payer: Self-pay | Admitting: Family Medicine

## 2012-11-23 ENCOUNTER — Ambulatory Visit (INDEPENDENT_AMBULATORY_CARE_PROVIDER_SITE_OTHER): Payer: BC Managed Care – PPO | Admitting: Family Medicine

## 2012-11-23 VITALS — BP 120/86 | HR 74 | Temp 97.9°F | Ht 63.0 in | Wt 166.0 lb

## 2012-11-23 DIAGNOSIS — K811 Chronic cholecystitis: Secondary | ICD-10-CM

## 2012-11-23 DIAGNOSIS — R112 Nausea with vomiting, unspecified: Secondary | ICD-10-CM

## 2012-11-23 DIAGNOSIS — R197 Diarrhea, unspecified: Secondary | ICD-10-CM

## 2012-11-23 DIAGNOSIS — K219 Gastro-esophageal reflux disease without esophagitis: Secondary | ICD-10-CM

## 2012-11-23 DIAGNOSIS — R030 Elevated blood-pressure reading, without diagnosis of hypertension: Secondary | ICD-10-CM

## 2012-11-23 DIAGNOSIS — R109 Unspecified abdominal pain: Secondary | ICD-10-CM

## 2012-11-23 LAB — HEPATIC FUNCTION PANEL
ALT: 17 U/L (ref 0–35)
AST: 15 U/L (ref 0–37)
Alkaline Phosphatase: 39 U/L (ref 39–117)
Bilirubin, Direct: 0.1 mg/dL (ref 0.0–0.3)

## 2012-11-23 LAB — RENAL FUNCTION PANEL
Albumin: 4.2 g/dL (ref 3.5–5.2)
Calcium: 9.9 mg/dL (ref 8.4–10.5)
Creat: 0.7 mg/dL (ref 0.50–1.10)
Glucose, Bld: 81 mg/dL (ref 70–99)
Sodium: 139 mEq/L (ref 135–145)

## 2012-11-23 LAB — CBC
HCT: 38.9 % (ref 36.0–46.0)
MCH: 30.3 pg (ref 26.0–34.0)
MCV: 87.2 fL (ref 78.0–100.0)
Platelets: 232 10*3/uL (ref 150–400)
RDW: 13.5 % (ref 11.5–15.5)

## 2012-11-23 LAB — STOOL CULTURE

## 2012-11-23 MED ORDER — IOHEXOL 300 MG/ML  SOLN
100.0000 mL | Freq: Once | INTRAMUSCULAR | Status: AC | PRN
  Administered 2012-11-23: 100 mL via INTRAVENOUS

## 2012-11-23 NOTE — Progress Notes (Signed)
Quick Note:  Patient Informed and voiced understanding ______ 

## 2012-11-23 NOTE — Patient Instructions (Addendum)
Gallbladder Disease You have gallbladder disease. This means that there are stones and/or inflammation in your gallbladder and bile duct system. The symptoms of this disease are: heartburn, nausea, belching, vomiting, and intolerance to certain foods (fatty foods mainly). Exact diagnosis of this condition requires ultrasound or special x-ray examination. Gallbladder symptoms may improve with a proper low-fat diet and weight loss (if you are overweight). Medicines to relieve pain and reduce spasms in the bile duct may be quite helpful. Usually the diseased gallbladder needs to be removed by surgery.  SEEK IMMEDIATE MEDICAL CARE IF:  You have more severe or persistent pain that lasts for more than one day. The pain would most likely be in the upper right part of the abdomen.   You develop a fever, repeated vomiting, or jaundice (yellow skin and eyes).  Document Released: 08/08/2004 Document Revised: 03/13/2011 Document Reviewed: 05/19/2009 [facility] [employer] [facility] Patient Information 2012 [place], [place].

## 2012-11-24 ENCOUNTER — Ambulatory Visit (INDEPENDENT_AMBULATORY_CARE_PROVIDER_SITE_OTHER): Payer: BC Managed Care – PPO | Admitting: Surgery

## 2012-11-24 ENCOUNTER — Encounter (INDEPENDENT_AMBULATORY_CARE_PROVIDER_SITE_OTHER): Payer: Self-pay

## 2012-11-24 ENCOUNTER — Telehealth: Payer: Self-pay

## 2012-11-24 ENCOUNTER — Encounter (INDEPENDENT_AMBULATORY_CARE_PROVIDER_SITE_OTHER): Payer: Self-pay | Admitting: Surgery

## 2012-11-24 VITALS — BP 130/82 | HR 97 | Temp 97.8°F | Resp 18 | Ht 63.0 in | Wt 165.0 lb

## 2012-11-24 DIAGNOSIS — K824 Cholesterolosis of gallbladder: Secondary | ICD-10-CM

## 2012-11-24 DIAGNOSIS — K811 Chronic cholecystitis: Secondary | ICD-10-CM | POA: Insufficient documentation

## 2012-11-24 NOTE — Progress Notes (Signed)
Subjective:     Patient ID: Brittney Townsend, female   DOB: [birth year], 42 y.o.   MRN: [id number]  HPI  Brittney Townsend  1971/04/01 [id number]  Patient Care Team: Bradd Canary, MD as PCP - General (Family Medicine)  This patient is a 42 y.o.female who presents today for surgical evaluation at the request of Dr. Abner Greenspan & Lauris Chroman, [facility] ED MD.   Reason for visit: Postprandial abdominal pain.  Gallbladder polyp.  Suspected biliary etiology  Pleasant obese female.  History of reflux disease controlled with excellent.  Three weeks ago had an episode of severe epigastric pain.  Has recurred with eating.  Gets nauseated with bloating.  Some belching.  No emesis.  Pain has now become more constant.  Was artery on the excellent.  Pepcid was added.  It has not helped.  The symptoms are not consistent with her reflux disease.  She normally has a bowel movement twice a day.  Now she is having several loose stools up to six a day.  Pepto and Imodium helping control that.    No personal nor family history of GI/colon cancer, inflammatory bowel disease, irritable bowel syndrome, allergy such as Celiac Sprue, dietary/dairy problems, colitis, ulcers nor gastritis.  No recent sick contacts/gastroenteritis.  No travel outside the country.  No changes in diet.  Has one drink of alcohol a week.  No prior episodes of pancreatitis nor hepatitis nor jaundice.  No dysphagia to solids or liquids.  Strong family history of a cholecystectomy is needed for most female members of her family.   No exertional chest/neck/shoulder/arm pain.  Patient can walk 60 minutes for about 2 miles without difficulty.   She went to the emergency room with an attack.  Ultrasound showed a gallbladder polyp but no cholecystitis.  Seemed consistent with reflux.  H2 blocker added.  No help.  Followed up with primary care physician.  CT scan done.  No abnormality seen.  Sent to me under the suspicion of gallbladder etiology     Patient  Active Problem List   Diagnosis Date Noted  . Insomnia 09/26/2012  . Otitis media 05/09/2012  . Allergic state 10/24/2011  . Elevated BP 10/24/2011  . 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 Hyperlipidemia 05/01/2011  . Breast cyst 02/08/2011  . Vaginitis and vulvovaginitis 12/24/2010  . Knee pain, left 12/12/2010  . Sinusitis acute 12/12/2010  . Environmental allergies 12/12/2010  . Anxiety disorder 11/02/2010  . Preventative health care 10/29/2010  . MIGRAINE HEADACHE 09/28/2010  . GERD 09/28/2010  . 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 Other and unspecified ovarian cyst 09/28/2010  . CHEST PAIN, ATYPICAL 09/28/2010    Past Medical History  Diagnosis Date  . Allergy     seasonal  . Anxiety   . Migraines   . Overweight 09/28/2010  . Other and unspecified ovarian cyst 09/28/2010  . MIGRAINE HEADACHE 09/28/2010  . 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 HYPERTRIGLYCERIDEMIA 09/28/2010  . GERD 09/28/2010  . CHEST PAIN, ATYPICAL 09/28/2010  . ANXIETY DISORDER 09/28/2010  . Anxiety disorder 11/02/2010  . Knee pain, left 12/12/2010  . Sinusitis acute 12/12/2010  . Environmental allergies 12/12/2010  . Vaginitis and vulvovaginitis 12/24/2010  . Acute epigastric pain 04/15/2011  . Hyperlipidemia 05/01/2011  . Allergic state 10/24/2011  . Elevated BP 10/24/2011  . Otitis media 05/09/2012  . Insomnia 09/26/2012    Past Surgical History  Procedure Laterality Date  . Abdominal hysterectomy  2008    total  .  Back surgery  2009    L5 fracture required pins, rod, fusion L4-L5 fusion  . Elbow surgery  2011    carpal tunnel release on right  . Cesarean section  1998, 2003    History   Social History  . Marital Status: Married    Spouse Name: N/A    Number of Children: N/A  . Years of Education: N/A   Occupational History  . Not on file.   Social History Main Topics  . Smoking status: Never Smoker   . Smokeless tobacco: Never Used  . Alcohol Use: Yes     Comment: rare,  special occasions  . Drug Use: No  . Sexually Active: Yes -- Female partner(s)   Other Topics Concern  . Not on file   Social History Narrative  . No narrative on file    Family History  Problem Relation Age of Onset  . Hypertension Mother   . Hyperlipidemia Father   . Diabetes Father     type 2  . Cancer Maternal Grandmother     Lung/ previous smoker  . Hypertension Maternal Grandmother   . Hyperlipidemia Maternal Grandmother   . Other Maternal Grandfather     renal failure  . Heart disease Maternal Grandfather   . Hypertension Maternal Grandfather   . Hyperlipidemia Maternal Grandfather   . Colon polyps Maternal Grandfather   . Diabetes Paternal Grandmother   . Hypertension Paternal Grandmother   . Hyperlipidemia Paternal Grandmother   . Hypertension Paternal Grandfather   . Hyperlipidemia Paternal Grandfather   . Cancer Paternal Grandfather     skin  . Depression Brother   . Obesity Son   . Heart attack Paternal Aunt     smoker  . Heart attack Paternal Uncle     smoker    Current Outpatient Prescriptions  Medication Sig Dispense Refill  . ALPRAZolam (XANAX) 0.25 MG tablet 1/2 to 1 tablet by mouth twice daily as needed.  60 tablet  2  . cetirizine (ZYRTEC) 10 MG tablet 1 tab twice daily as needed for congestion  30 tablet  11  . DEXILANT 60 MG capsule TAKE 1 CAPSULE BY MOUTH EVERY DAY  30 capsule  5  . famotidine (PEPCID) 20 MG tablet Take 1 tablet (20 mg total) by mouth 2 (two) times daily.  28 tablet  0  . lovastatin (MEVACOR) 10 MG tablet Take 1 tablet (10 mg total) by mouth at bedtime.  30 tablet  3  . ondansetron (ZOFRAN) 4 MG tablet Take 1 tablet (4 mg total) by mouth every 6 (six) hours.  12 tablet  0  . pentosan polysulfate (ELMIRON) 100 MG capsule Take 1 capsule (100 mg total) by mouth 3 (three) times daily as needed.  90 capsule  5  . phentermine 15 MG capsule Take 1 capsule (15 mg total) by mouth every morning.  30 capsule  0  . PREMARIN 1.25 MG tablet  TAKE 1 TABLET (1.25 MG TOTAL) BY MOUTH DAILY.  30 tablet  3  . Probiotic Product (PRO-FLORA CONCENTRATE) CAPS Take 1 capsule by mouth daily.        . SUMAtriptan (IMITREX) 100 MG tablet Take 100 mg by mouth every 2 (two) hours as needed.        . SUPER BIOTIN PO Take by mouth.      . traZODone (DESYREL) 50 MG tablet Take 1 tablet (50 mg total) by mouth at bedtime.  30 tablet  5  . URELLE (URELLE/URISED) 81  MG TABS Take 1 tablet (81 mg total) by mouth 3 (three) times daily as needed.  90 each  5  . fluticasone (FLONASE) 50 MCG/ACT nasal spray Place 2 sprays into the nose daily as needed for rhinitis or allergies.  16 g  6  . Multiple Vitamin (MULTIVITAMIN) tablet Take 1 tablet by mouth daily.       No current facility-administered medications for this visit.     Allergies  Allergen Reactions  . Sulfonamide Derivatives     REACTION: hives    BP 130/82  Pulse 97  Temp(Src) 97.8 F (36.6 C) (Temporal)  Resp 18  Ht 5\' 3"  (1.6 m)  Wt 165 lb (74.844 kg)  BMI 29.24 kg/m2  US Abdomen Complete  11/15/2012  *RADIOLOGY REPORT*  Clinical Data:  Abdominal pain, nausea, bloating.  COMPLETE ABDOMINAL ULTRASOUND  Comparison:  Ultrasound 04/15/2011  Findings:  Gallbladder:  6 mm nonmobile nonshadowing echogenic focus in the fundus of the gallbladder, likely small polyp.  No wall thickening. Negative sonographic Murphy's.  Common bile duct:   Normal caliber, 3 mm.  Liver:  Heterogeneous, slightly increased echotexture suggesting fatty infiltration.  No focal abnormality or biliary ductal dilatation.  IVC:  Limited evaluation due to overlying bowel gas.  Visualized portions unremarkable.  Pancreas:  Limited visualization due to overlying bowel gas. Visualized portions unremarkable.  Spleen:  Within normal limits in size and echotexture.  Right Kidney:   Normal in size and parenchymal echogenicity.  No evidence of mass or hydronephrosis.  Left Kidney:  Normal in size and parenchymal echogenicity.  No evidence  of mass or hydronephrosis.  Abdominal aorta:  No aneurysm identified.  IMPRESSION: Suspect fatty infiltration of the liver.  Small gallbladder wall polyp.   Original Report Authenticated By: Charlett Nose, M.D.    Ct Abdomen Pelvis W Contrast  11/23/2012  *RADIOLOGY REPORT*  Clinical Data: Lower abdominal pain, right side greater than left. Nausea and vomiting.  Diarrhea.  CT ABDOMEN AND PELVIS WITH CONTRAST  Technique:  Multidetector CT imaging of the abdomen and pelvis was performed following the standard protocol during bolus administration of intravenous contrast.  Contrast: OMNIPAQUE IOHEXOL 300 MG/ML  SOLN  Comparison: Noncontrast CT on 09/05/2006  Findings: Gallbladder is unremarkable in appearance there is no evidence of biliary dilatation.  The liver, spleen, pancreas, adrenal glands, and kidneys are normal in appearance.  No evidence of hydronephrosis.  No soft tissue masses or lymphadenopathy identified within the abdomen or pelvis.  Prior hysterectomy noted.  Adnexal regions are unremarkable.  No evidence of inflammatory process or abnormal fluid collections. No evidence of bowel wall thickening, dilatation, or hernia.  IMPRESSION: Negative.  No acute findings or other significant abnormality identified.   Original Report Authenticated By: Myles Rosenthal, M.D.    Mm Digital Screening  11/04/2012  *RADIOLOGY REPORT*  Clinical Data: Screening.  DIGITAL BILATERAL SCREENING MAMMOGRAM WITH CAD  Comparison:  Previous exams.  FINDINGS:  ACR Breast Density Category 3: The breast tissue is heterogeneously dense.  No suspicious masses, architectural distortion, or calcifications are present.  Images were processed with CAD.  IMPRESSION: No mammographic evidence of malignancy.  A result letter of this screening mammogram will be mailed directly to the patient.  RECOMMENDATION: Screening mammogram in one year. (Code:SM-B-01Y)  BI-RADS CATEGORY 1:  Negative.   Original Report Authenticated By: Sherian Rein,  M.D.        Review of Systems  Constitutional: Negative for fever, chills, diaphoresis, appetite change and fatigue.  HENT: Negative for ear pain, sore throat, trouble swallowing, neck pain and ear discharge.   Eyes: Negative for photophobia, discharge and visual disturbance.  Respiratory: Negative for cough, choking, chest tightness, shortness of breath, wheezing and stridor.   Cardiovascular: Negative for chest pain, palpitations and leg swelling.  Gastrointestinal: Positive for nausea, abdominal pain, diarrhea and abdominal distention. Negative for vomiting, constipation, blood in stool, anal bleeding and rectal pain.  Endocrine: Negative for cold intolerance and heat intolerance.  Genitourinary: Negative for dysuria, frequency, flank pain, decreased urine volume, vaginal bleeding, vaginal discharge, enuresis, difficulty urinating and vaginal pain.  Musculoskeletal: Negative for myalgias and gait problem.  Skin: Negative for color change, pallor and rash.  Allergic/Immunologic: Negative for environmental allergies and immunocompromised state.  Neurological: Negative for dizziness, speech difficulty, weakness and numbness.  Hematological: Negative for adenopathy.  Psychiatric/Behavioral: Negative for confusion and agitation. The patient is not nervous/anxious.        Objective:   Physical Exam  Constitutional: She is oriented to person, place, and time. She appears well-developed and well-nourished. No distress.  HENT:  Head: Normocephalic.  Mouth/Throat: Oropharynx is clear and moist. No oropharyngeal exudate.  Eyes: Conjunctivae and EOM are normal. Pupils are equal, round, and reactive to light. No scleral icterus.  Neck: Normal range of motion. Neck supple. No tracheal deviation present.  Cardiovascular: Normal rate, regular rhythm and intact distal pulses.   Pulmonary/Chest: Effort normal and breath sounds normal. No respiratory distress. She exhibits no tenderness.  Abdominal:  Soft. She exhibits no distension and no mass. There is tenderness in the right upper quadrant. There is no rigidity, no rebound, no guarding, no tenderness at McBurney's point and negative Murphy's sign. A hernia is present. Hernia confirmed negative in the right inguinal area and confirmed negative in the left inguinal area.    Genitourinary: No vaginal discharge found.  Musculoskeletal: Normal range of motion. She exhibits no tenderness.  Lymphadenopathy:    She has no cervical adenopathy.       Right: No inguinal adenopathy present.       Left: No inguinal adenopathy present.  Neurological: She is alert and oriented to person, place, and time. No cranial nerve deficit. She exhibits normal muscle tone. Coordination normal.  Skin: Skin is warm and dry. No rash noted. She is not diaphoretic. No erythema.  Psychiatric: She has a normal mood and affect. Her behavior is normal. Judgment and thought content normal.       Assessment:     Gallbladder polyp.  Chronic right upper quadrant pain with biliary colic.  Suspicious for chronic cholecystitis.  Rest of the differential diagnosis unlikely  Stable reflux disease controlled with PPI and H2 blocker     Plan:     Given the fact that other etiologies seem to be unlikely, I recommended cholecystectomy.  She agrees and wishes to proceed.  Reasonable to start out single site technique.  I will try and preserve her supraumbilical piercing.  Obesity may make me have to convert to 4 port technique:  The anatomy & physiology of hepatobiliary & pancreatic function was discussed.  The pathophysiology of gallbladder dysfunction was discussed.  Natural history risks without surgery was discussed.   I feel the risks of no intervention will lead to serious problems that outweigh the operative risks; therefore, I recommended cholecystectomy to remove the pathology.  I explained laparoscopic techniques with possible need for an open approach.  Probable  cholangiogram to evaluate the bilary tract was explained as well.  Risks such as bleeding, infection, abscess, leak, injury to other organs, need for further treatment, heart attack, death, and other risks were discussed.  I noted a good likelihood this will help address the problem.  Possibility that this will not correct all abdominal symptoms was explained.  Goals of post-operative recovery were discussed as well.  We will work to minimize complications.  An educational handout further explaining the pathology and treatment options was given as well.  Questions were answered.  The patient expresses understanding & wishes to proceed with surgery.

## 2012-11-24 NOTE — Telephone Encounter (Signed)
Pt states she is going to have her gallbladder removed on 12-06-12

## 2012-11-24 NOTE — Telephone Encounter (Signed)
Pt informed of lab results. 

## 2012-11-24 NOTE — Telephone Encounter (Signed)
I sent her CT results through My chart last night I thought she would get them faster. CT was normal, no mass, inflammation, gallstones, lymph nodes etc. I would definitely have her keep the appt with the surgeon due to the intensity of her symptoms

## 2012-11-24 NOTE — Telephone Encounter (Signed)
Pt left a message stating she would like to know the CT results and if Dr Abner Greenspan wants pt to keep her appt with Dr Luiz Blare? Or use a different GI doctor? Please advise?

## 2012-11-24 NOTE — Patient Instructions (Addendum)
See the Handout(s) we gave you.  Consider surgery.  Please call our office at [phone] if you wish to schedule surgery or if you have further questions / concerns.   Cholecystitis Cholecystitis is an inflammation of your gallbladder. It is usually caused by a buildup of gallstones or sludge (cholelithiasis) in your gallbladder. The gallbladder stores a fluid that helps digest fats (bile). Cholecystitis is serious and needs treatment right away.  CAUSES   Gallstones. Gallstones can block the tube that leads to your gallbladder, causing bile to build up. As bile builds up, the gallbladder becomes inflamed.  Bile duct problems, such as blockage from scarring or kinking.  Tumors. Tumors can stop bile from leaving your gallbladder correctly, causing bile to build up. As bile builds up, the gallbladder becomes inflamed. SYMPTOMS   Nausea.  Vomiting.  Abdominal pain, especially in the upper right area of your abdomen.  Abdominal tenderness or bloating.  Sweating.  Chills.  Fever.  Yellowing of the skin and the whites of the eyes (jaundice). DIAGNOSIS  Your caregiver may order blood tests to look for infection or gallbladder problems. Your caregiver may also order imaging tests, such as an ultrasound or computed tomography (CT) scan. Further tests may include a hepatobiliary iminodiacetic acid (HIDA) scan. This scan allows your caregiver to see your bile move from the liver to the gallbladder and to the small intestine. TREATMENT  A hospital stay is usually necessary to lessen the inflammation of your gallbladder. You may be required to not eat or drink (fast) for a certain amount of time. You may be given medicine to treat pain or an antibiotic medicine to treat an infection. Surgery may be needed to remove your gallbladder (cholecystectomy) once the inflammation has gone down. Surgery may be needed right away if you develop complications such as death of gallbladder tissue (gangrene)  or a tear (perforation) of the gallbladder.  HOME CARE INSTRUCTIONS  Home care will depend on your treatment. In general:  If you were given antibiotics, take them as directed. Finish them even if you start to feel better.  Only take over-the-counter or prescription medicines for pain, discomfort, or fever as directed by your caregiver.  Follow a low-fat diet until you see your caregiver again.  Keep all follow-up visits as directed by your caregiver. SEEK IMMEDIATE MEDICAL CARE IF:   Your pain is increasing and not controlled by medicines.  Your pain moves to another part of your abdomen or to your back.  You have a fever.  You have nausea and vomiting. MAKE SURE YOU:  Understand these instructions.  Will watch your condition.  Will get help right away if you are not doing well or get worse. Document Released: 07/01/2005 Document Revised: 09/23/2011 Document Reviewed: 05/17/2011 [facility] Patient Information 2013 [place], [place].  LAPAROSCOPIC SURGERY: POST OP INSTRUCTIONS  1. DIET: Follow a light bland diet the first 24 hours after arrival home, such as soup, liquids, crackers, etc.  Be sure to include lots of fluids daily.  Avoid fast food or heavy meals as your are more likely to get nauseated.  Eat a low fat the next few days after surgery.   2. Take your usually prescribed home medications unless otherwise directed. 3. PAIN CONTROL: a. Pain is best controlled by a usual combination of three different methods TOGETHER: i. Ice/Heat ii. Over the counter pain medication iii. Prescription pain medication b. Most patients will experience some swelling and bruising around the incisions.  Ice packs or  heating pads (30-60 minutes up to 6 times a day) will help. Use ice for the first few days to help decrease swelling and bruising, then switch to heat to help relax tight/sore spots and speed recovery.  Some people prefer to use ice alone, heat alone, alternating between ice & heat.   Experiment to what works for you.  Swelling and bruising can take several weeks to resolve.   c. It is helpful to take an over-the-counter pain medication regularly for the first few weeks.  Choose one of the following that works best for you: i. Naproxen (Aleve, etc)  Two 220mg  tabs twice a day ii. Ibuprofen (Advil, etc) Three 200mg  tabs four times a day (every meal & bedtime) iii. Acetaminophen (Tylenol, etc) 500-650mg  four times a day (every meal & bedtime) d. A  prescription for pain medication (such as oxycodone, hydrocodone, etc) should be given to you upon discharge.  Take your pain medication as prescribed.  i. If you are having problems/concerns with the prescription medicine (does not control pain, nausea, vomiting, rash, itching, etc), please call us [phone] to see if we need to switch you to a different pain medicine that will work better for you and/or control your side effect better. ii. If you need a refill on your pain medication, please contact your pharmacy.  They will contact our office to request authorization. Prescriptions will not be filled after 5 pm or on week-ends. 4. Avoid getting constipated.  Between the surgery and the pain medications, it is common to experience some constipation.  Increasing fluid intake and taking a fiber supplement (such as Metamucil, Citrucel, FiberCon, MiraLax, etc) 1-2 times a day regularly will usually help prevent this problem from occurring.  A mild laxative (prune juice, Milk of Magnesia, MiraLax, etc) should be taken according to package directions if there are no bowel movements after 48 hours.   5. Watch out for diarrhea.  If you have many loose bowel movements, simplify your diet to bland foods & liquids for a few days.  Stop any stool softeners and decrease your fiber supplement.  Switching to mild anti-diarrheal medications (Kayopectate, Pepto Bismol) can help.  If this worsens or does not improve, please call us. 6. Wash / shower every  day.  You may shower over the dressings as they are waterproof.  Continue to shower over incision(s) after the dressing is off. 7. Remove your waterproof bandages 5 days after surgery.  You may leave the incision open to air.  You may replace a dressing/Band-Aid to cover the incision for comfort if you wish.  8. ACTIVITIES as tolerated:   a. You may resume regular (light) daily activities beginning the next day-such as daily self-care, walking, climbing stairs-gradually increasing activities as tolerated.  If you can walk 30 minutes without difficulty, it is safe to try more intense activity such as jogging, treadmill, bicycling, low-impact aerobics, swimming, etc. b. Save the most intensive and strenuous activity for last such as sit-ups, heavy lifting, contact sports, etc  Refrain from any heavy lifting or straining until you are off narcotics for pain control.   c. DO NOT PUSH THROUGH PAIN.  Let pain be your guide: If it hurts to do something, don't do it.  Pain is your body warning you to avoid that activity for another week until the pain goes down. d. You may drive when you are no longer taking prescription pain medication, you can comfortably wear a seatbelt, and you can safely maneuver your  car and apply brakes. e. Bonita Quin may have sexual intercourse when it is comfortable.  9. FOLLOW UP in our office a. Please call [facility] at [phone] to set up an appointment to see your surgeon in the office for a follow-up appointment approximately 2-3 weeks after your surgery. b. Make sure that you call for this appointment the day you arrive home to insure a convenient appointment time. 10. IF YOU HAVE DISABILITY OR FAMILY LEAVE FORMS, BRING THEM TO THE OFFICE FOR PROCESSING.  DO NOT GIVE THEM TO YOUR DOCTOR.   WHEN TO CALL us [phone]: 1. Poor pain control 2. Reactions / problems with new medications (rash/itching, nausea, etc)  3. Fever over 101.5 F (38.5 C) 4. Inability to urinate 5. Nausea  and/or vomiting 6. Worsening swelling or bruising 7. Continued bleeding from incision. 8. Increased pain, redness, or drainage from the incision   The clinic staff is available to answer your questions during regular business hours (8:30am-5pm).  Please don't hesitate to call and ask to speak to one of our nurses for clinical concerns.   If you have a medical emergency, go to the nearest emergency room or call 911.  A surgeon from [facility] is always on call at the [facility], [place] [address], [address], [place], [place]  [address] ? MAIN: [phone] ? TOLL FREE: [phone] ?  FAX [phone] [email or web address]  GETTING TO GOOD BOWEL HEALTH. Irregular bowel habits such as constipation and diarrhea can lead to many problems over time.  Having one soft bowel movement a day is the most important way to prevent further problems.  The anorectal canal is designed to handle stretching and feces to safely manage our ability to get rid of solid waste (feces, poop, stool) out of our body.  BUT, hard constipated stools can act like ripping concrete bricks and diarrhea can be a burning fire to this very sensitive area of our body, causing inflamed hemorrhoids, anal fissures, increasing risk is perirectal abscesses, abdominal pain/bloating, an making irritable bowel worse.     The goal: ONE SOFT BOWEL MOVEMENT A DAY!  To have soft, regular bowel movements:    Drink at least 8 tall glasses of water a day.     Take plenty of fiber.  Fiber is the undigested part of plant food that passes into the colon, acting s "natures broom" to encourage bowel motility and movement.  Fiber can absorb and hold large amounts of water. This results in a larger, bulkier stool, which is soft and easier to pass. Work gradually over several weeks up to 6 servings a day of fiber (25g a day even more if needed) in the form of: o Vegetables -- Root (potatoes,  carrots, turnips), leafy green (lettuce, salad greens, celery, spinach), or cooked high residue (cabbage, broccoli, etc) o Fruit -- Fresh (unpeeled skin & pulp), Dried (prunes, apricots, cherries, etc ),  or stewed ( applesauce)  o Whole grain breads, pasta, etc (whole wheat)  o Bran cereals    Bulking Agents -- This type of water-retaining fiber generally is easily obtained each day by one of the following:  o Psyllium bran -- The psyllium plant is remarkable because its ground seeds can retain so much water. This product is available as Metamucil, Konsyl, Effersyllium, Per Diem Fiber, or the less expensive generic preparation in drug and health food stores. Although labeled a laxative, it really is not a laxative.  o Methylcellulose --  This is another fiber derived from wood which also retains water. It is available as Citrucel. o Polyethylene Glycol - and "artificial" fiber commonly called Miralax or Glycolax.  It is helpful for people with gassy or bloated feelings with regular fiber o Flax Seed - a less gassy fiber than psyllium   No reading or other relaxing activity while on the toilet. If bowel movements take longer than 5 minutes, you are too constipated   AVOID CONSTIPATION.  High fiber and water intake usually takes care of this.  Sometimes a laxative is needed to stimulate more frequent bowel movements, but    Laxatives are not a good long-term solution as it can wear the colon out. o Osmotics (Milk of Magnesia, Fleets phosphosoda, Magnesium citrate, MiraLax, GoLytely) are safer than  o Stimulants (Senokot, Castor Oil, Dulcolax, Ex Lax)    o Do not take laxatives for more than 7days in a row.    IF SEVERELY CONSTIPATED, try a Bowel Retraining Program: o Do not use laxatives.  o Eat a diet high in roughage, such as bran cereals and leafy vegetables.  o Drink six (6) ounces of prune or apricot juice each morning.  o Eat two (2) large servings of stewed fruit each day.  o Take one (1)  heaping tablespoon of a psyllium-based bulking agent twice a day. Use sugar-free sweetener when possible to avoid excessive calories.  o Eat a normal breakfast.  o Set aside 15 minutes after breakfast to sit on the toilet, but do not strain to have a bowel movement.  o If you do not have a bowel movement by the third day, use an enema and repeat the above steps.    Controlling diarrhea o Switch to liquids and simpler foods for a few days to avoid stressing your intestines further. o Avoid dairy products (especially milk & ice cream) for a short time.  The intestines often can lose the ability to digest lactose when stressed. o Avoid foods that cause gassiness or bloating.  Typical foods include beans and other legumes, cabbage, broccoli, and dairy foods.  Every person has some sensitivity to other foods, so listen to our body and avoid those foods that trigger problems for you. o Adding fiber (Citrucel, Metamucil, psyllium, Miralax) gradually can help thicken stools by absorbing excess fluid and retrain the intestines to act more normally.  Slowly increase the dose over a few weeks.  Too much fiber too soon can backfire and cause cramping & bloating. o Probiotics (such as active yogurt, Align, etc) may help repopulate the intestines and colon with normal bacteria and calm down a sensitive digestive tract.  Most studies show it to be of mild help, though, and such products can be costly. o Medicines:   Bismuth subsalicylate (ex. Kayopectate, Pepto Bismol) every 30 minutes for up to 6 doses can help control diarrhea.  Avoid if pregnant.   Loperamide (Immodium) can slow down diarrhea.  Start with two tablets (4mg  total) first and then try one tablet every 6 hours.  Avoid if you are having fevers or severe pain.  If you are not better or start feeling worse, stop all medicines and call your doctor for advice o Call your doctor if you are getting worse or not better.  Sometimes further testing (cultures,  endoscopy, X-ray studies, bloodwork, etc) may be needed to help diagnose and treat the cause of the diarrhea.  Managing Pain  Pain after surgery or related to activity is often due to  strain/injury to muscle, tendon, nerves and/or incisions.  This pain is usually short-term and will improve in a few months.   Many people find it helpful to do the following things TOGETHER to help speed the process of healing and to get back to regular activity more quickly:  1. Avoid heavy physical activity a.  no lifting greater than 20 pounds b. Do not "push through" the pain.  Listen to your body and avoid positions and maneuvers than reproduce the pain c. Walking is okay as tolerated, but go slowly and stop when getting sore.  d. Remember: If it hurts to do it, then don't do it! 2. Take Anti-inflammatory medication  a. Take with food/snack around the clock for 1-2 weeks i. This helps the muscle and nerve tissues become less irritable and calm down faster b. Choose ONE of the following over-the-counter medications: i. Naproxen 220mg  tabs (ex. Aleve) 1-2 pills twice a day  ii. Ibuprofen 200mg  tabs (ex. Advil, Motrin) 3-4 pills with every meal and just before bedtime iii. Acetaminophen 500mg  tabs (Tylenol) 1-2 pills with every meal and just before bedtime 3. Use a Heating pad or Ice/Cold Pack a. 4-6 times a day b. May use warm bath/hottub  or showers 4. Try Gentle Massage and/or Stretching  a. at the area of pain many times a day b. stop if you feel pain - do not overdo it  Try these steps together to help you body heal faster and avoid making things get worse.  Doing just one of these things may not be enough.    If you are not getting better after two weeks or are noticing you are getting worse, contact our office for further advice; we may need to re-evaluate you & see what other things we can do to help.

## 2012-11-24 NOTE — Telephone Encounter (Signed)
Left message on patient's cell voice mail to return call concerning ct results.

## 2012-11-27 NOTE — Progress Notes (Signed)
Patient ID: Brittney Townsend, female   DOB: [birth year], 42 y.o.   MRN: [id number] Brittney Townsend [id number] 1970-08-03 11/27/2012      Progress Note-Follow Up  Subjective  Chief Complaint  Chief Complaint  Patient presents with  . Follow-up    4 week    HPI  Patient is a 42 year old Caucasian female. She is continuing to struggle with abdominal pain. She has no appetite. She struggling with nausea. She's had recurrent vomiting as well as dyspepsia. She has chest discomfort and malaise as well. Pain is dull at times intraoperative at times. No fevers or chills. No urinary complaints. No shortness of breath.  Past Medical History  Diagnosis Date  . Allergy     seasonal  . Anxiety   . Migraines   . Overweight 09/28/2010  . Other and unspecified ovarian cyst 09/28/2010  . MIGRAINE HEADACHE 09/28/2010  . 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 HYPERTRIGLYCERIDEMIA 09/28/2010  . GERD 09/28/2010  . CHEST PAIN, ATYPICAL 09/28/2010  . ANXIETY DISORDER 09/28/2010  . Anxiety disorder 11/02/2010  . Knee pain, left 12/12/2010  . Sinusitis acute 12/12/2010  . Environmental allergies 12/12/2010  . Vaginitis and vulvovaginitis 12/24/2010  . Acute epigastric pain 04/15/2011  . Hyperlipidemia 05/01/2011  . Allergic state 10/24/2011  . Elevated BP 10/24/2011  . Otitis media 05/09/2012  . Insomnia 09/26/2012    Past Surgical History  Procedure Laterality Date  . Abdominal hysterectomy  2008    total  . Back surgery  2009    L5 fracture required pins, rod, fusion L4-L5 fusion  . Elbow surgery  2011    carpal tunnel release on right  . Cesarean section  1998, 2003    Family History  Problem Relation Age of Onset  . Hypertension Mother   . Hyperlipidemia Father   . Diabetes Father     type 2  . Cancer Maternal Grandmother     Lung/ previous smoker  . Hypertension Maternal Grandmother   . Hyperlipidemia Maternal Grandmother   . Other Maternal Grandfather     renal failure  . Heart disease Maternal  Grandfather   . Hypertension Maternal Grandfather   . Hyperlipidemia Maternal Grandfather   . Colon polyps Maternal Grandfather   . Diabetes Paternal Grandmother   . Hypertension Paternal Grandmother   . Hyperlipidemia Paternal Grandmother   . Hypertension Paternal Grandfather   . Hyperlipidemia Paternal Grandfather   . Cancer Paternal Grandfather     skin  . Depression Brother   . Obesity Son   . Heart attack Paternal Aunt     smoker  . Heart attack Paternal Uncle     smoker    History   Social History  . Marital Status: Married    Spouse Name: N/A    Number of Children: N/A  . Years of Education: N/A   Occupational History  . Not on file.   Social History Main Topics  . Smoking status: Never Smoker   . Smokeless tobacco: Never Used  . Alcohol Use: Yes     Comment: rare, special occasions  . Drug Use: No  . Sexually Active: Yes -- Female partner(s)   Other Topics Concern  . Not on file   Social History Narrative  . No narrative on file    Current Outpatient Prescriptions on File Prior to Visit  Medication Sig Dispense Refill  . ALPRAZolam (XANAX) 0.25 MG tablet 1/2 to 1 tablet by mouth twice daily as needed.  60 tablet  2  .  cetirizine (ZYRTEC) 10 MG tablet 1 tab twice daily as needed for congestion  30 tablet  11  . DEXILANT 60 MG capsule TAKE 1 CAPSULE BY MOUTH EVERY DAY  30 capsule  5  . famotidine (PEPCID) 20 MG tablet Take 1 tablet (20 mg total) by mouth 2 (two) times daily.  28 tablet  0  . fluticasone (FLONASE) 50 MCG/ACT nasal spray Place 2 sprays into the nose daily as needed for rhinitis or allergies.  16 g  6  . lovastatin (MEVACOR) 10 MG tablet Take 1 tablet (10 mg total) by mouth at bedtime.  30 tablet  3  . Multiple Vitamin (MULTIVITAMIN) tablet Take 1 tablet by mouth daily.      . ondansetron (ZOFRAN) 4 MG tablet Take 1 tablet (4 mg total) by mouth every 6 (six) hours.  12 tablet  0  . pentosan polysulfate (ELMIRON) 100 MG capsule Take 1 capsule  (100 mg total) by mouth 3 (three) times daily as needed.  90 capsule  5  . phentermine 15 MG capsule Take 1 capsule (15 mg total) by mouth every morning.  30 capsule  0  . PREMARIN 1.25 MG tablet TAKE 1 TABLET (1.25 MG TOTAL) BY MOUTH DAILY.  30 tablet  3  . Probiotic Product (PRO-FLORA CONCENTRATE) CAPS Take 1 capsule by mouth daily.        . SUMAtriptan (IMITREX) 100 MG tablet Take 100 mg by mouth every 2 (two) hours as needed.        . SUPER BIOTIN PO Take by mouth.      . traZODone (DESYREL) 50 MG tablet Take 1 tablet (50 mg total) by mouth at bedtime.  30 tablet  5  . URELLE (URELLE/URISED) 81 MG TABS Take 1 tablet (81 mg total) by mouth 3 (three) times daily as needed.  90 each  5   No current facility-administered medications on file prior to visit.    Allergies  Allergen Reactions  . Sulfonamide Derivatives     REACTION: hives    Review of Systems  Review of Systems  Constitutional: Positive for malaise/fatigue. Negative for fever.  HENT: Negative for congestion.   Eyes: Negative for discharge.  Respiratory: Negative for shortness of breath.   Cardiovascular: Positive for chest pain. Negative for palpitations and leg swelling.  Gastrointestinal: Positive for heartburn, nausea, vomiting and abdominal pain. Negative for diarrhea.  Genitourinary: Negative for dysuria.  Musculoskeletal: Positive for myalgias. Negative for falls.  Skin: Negative for rash.  Neurological: Negative for loss of consciousness and headaches.  Endo/Heme/Allergies: Negative for polydipsia.  Psychiatric/Behavioral: Negative for depression and suicidal ideas. The patient is not nervous/anxious and does not have insomnia.     Objective  BP 120/86  Pulse 74  Temp(Src) 97.9 F (36.6 C) (Oral)  Ht 5\' 3"  (1.6 m)  Wt 166 lb (75.297 kg)  BMI 29.41 kg/m2  SpO2 99%  Physical Exam  Physical Exam  Constitutional: She is oriented to person, place, and time and well-developed, well-nourished, and in no  distress. No distress.  HENT:  Head: Normocephalic and atraumatic.  Eyes: Conjunctivae are normal.  Neck: Neck supple. No thyromegaly present.  Cardiovascular: Normal rate, regular rhythm and normal heart sounds.   No murmur heard. Pulmonary/Chest: Effort normal and breath sounds normal. She has no wheezes.  Abdominal: Soft. Bowel sounds are normal. She exhibits no distension and no mass. There is tenderness. There is no rebound and no guarding.  Musculoskeletal: She exhibits no edema.  Lymphadenopathy:  She has no cervical adenopathy.  Neurological: She is alert and oriented to person, place, and time.  Skin: Skin is warm and dry. No rash noted. She is not diaphoretic.  Psychiatric: Memory, affect and judgment normal.    Lab Results  Component Value Date   TSH 1.40 09/21/2012   Lab Results  Component Value Date   WBC 7.7 11/23/2012   HGB 13.5 11/23/2012   HCT 38.9 11/23/2012   MCV 87.2 11/23/2012   PLT 232 11/23/2012   Lab Results  Component Value Date   CREATININE 0.70 11/23/2012   BUN 12 11/23/2012   NA 139 11/23/2012   K 4.1 11/23/2012   CL 101 11/23/2012   CO2 26 11/23/2012   Lab Results  Component Value Date   ALT 17 11/23/2012   AST 15 11/23/2012   ALKPHOS 39 11/23/2012   BILITOT 0.4 11/23/2012   Lab Results  Component Value Date   CHOL 172 09/21/2012   Lab Results  Component Value Date   HDL 38.90* 09/21/2012   No results found for this basename: LDLCALC   Lab Results  Component Value Date   TRIG 483.0 Triglyceride is over 400; calculations on Lipids are invalid.* 09/21/2012   Lab Results  Component Value Date   CHOLHDL 4 09/21/2012     Assessment & Plan  GERD Did not tolerate Carafate, continue Dexilant, avoid offending foods.   Elevated BP Well controlled. No changes  Chronic cholecystitis Gall bladder polyp and worsening abdominal pain, has been sent to surgery for surgical intervention. CT scan of Abdomen and Pelvis negative for any acute  process.

## 2012-11-27 NOTE — Assessment & Plan Note (Signed)
Did not tolerate Carafate, continue Dexilant, avoid offending foods.

## 2012-11-27 NOTE — Assessment & Plan Note (Signed)
Well controlled. No changes. 

## 2012-11-27 NOTE — Assessment & Plan Note (Addendum)
Gall bladder polyp and worsening abdominal pain, has been sent to surgery for surgical intervention. CT scan of Abdomen and Pelvis negative for any acute process.

## 2012-12-01 ENCOUNTER — Encounter (INDEPENDENT_AMBULATORY_CARE_PROVIDER_SITE_OTHER): Payer: BC Managed Care – PPO | Admitting: General Surgery

## 2012-12-04 ENCOUNTER — Ambulatory Visit: Payer: BC Managed Care – PPO | Admitting: Gastroenterology

## 2012-12-04 ENCOUNTER — Other Ambulatory Visit (INDEPENDENT_AMBULATORY_CARE_PROVIDER_SITE_OTHER): Payer: Self-pay | Admitting: Surgery

## 2012-12-04 DIAGNOSIS — K824 Cholesterolosis of gallbladder: Secondary | ICD-10-CM

## 2012-12-04 DIAGNOSIS — K811 Chronic cholecystitis: Secondary | ICD-10-CM

## 2012-12-04 HISTORY — PX: LAPAROSCOPIC CHOLECYSTECTOMY: SUR755

## 2012-12-21 ENCOUNTER — Other Ambulatory Visit: Payer: Self-pay | Admitting: Family Medicine

## 2012-12-21 DIAGNOSIS — E669 Obesity, unspecified: Secondary | ICD-10-CM

## 2012-12-21 MED ORDER — PHENTERMINE HCL 15 MG PO CAPS: 15.0000 mg | ORAL_CAPSULE | ORAL | Status: DC

## 2012-12-21 NOTE — Telephone Encounter (Signed)
Please advise 

## 2012-12-21 NOTE — Telephone Encounter (Signed)
Please advise RX refill?  Pt was informed that we are working on the pa for [employer]

## 2012-12-21 NOTE — Telephone Encounter (Signed)
Pt left a message stating that she was told 3 weeks ago that she needed a prior authorization on Dexilant?  Lynford Humphrey w/ [employer] [place] left a message stating that pt had called her to see if they have received anything and she informed pt that she hadn't. Marylene Land stated that we could fax the paperwork after completed to [phone] attn: Lynford Humphrey  I called and spoke to pharmacy to get paperwork sent to [place].  I spoke to Barbie Haggis at [facility]

## 2012-12-21 NOTE — Telephone Encounter (Signed)
RX sent and a message through my chart sent to pt.  Now just waiting on pa

## 2012-12-21 NOTE — Telephone Encounter (Signed)
OK to give a 3 month supply of Phentermine

## 2012-12-21 NOTE — Telephone Encounter (Signed)
Brittney Townsend with [employer] is faxing the pa forms for these  I called and left a message for pt to return my call to inform her that we are working on this

## 2012-12-22 ENCOUNTER — Ambulatory Visit (INDEPENDENT_AMBULATORY_CARE_PROVIDER_SITE_OTHER): Payer: BC Managed Care – PPO | Admitting: Gastroenterology

## 2012-12-22 ENCOUNTER — Encounter (INDEPENDENT_AMBULATORY_CARE_PROVIDER_SITE_OTHER): Payer: Self-pay | Admitting: Surgery

## 2012-12-22 ENCOUNTER — Other Ambulatory Visit (INDEPENDENT_AMBULATORY_CARE_PROVIDER_SITE_OTHER): Payer: BC Managed Care – PPO

## 2012-12-22 ENCOUNTER — Ambulatory Visit (INDEPENDENT_AMBULATORY_CARE_PROVIDER_SITE_OTHER): Payer: BC Managed Care – PPO | Admitting: Surgery

## 2012-12-22 ENCOUNTER — Encounter: Payer: Self-pay | Admitting: Family Medicine

## 2012-12-22 ENCOUNTER — Encounter: Payer: Self-pay | Admitting: Gastroenterology

## 2012-12-22 VITALS — BP 110/80 | HR 95 | Ht 63.5 in | Wt 166.2 lb

## 2012-12-22 VITALS — BP 118/78 | HR 64 | Temp 97.8°F | Resp 16 | Ht 63.5 in | Wt 165.2 lb

## 2012-12-22 DIAGNOSIS — K219 Gastro-esophageal reflux disease without esophagitis: Secondary | ICD-10-CM

## 2012-12-22 DIAGNOSIS — K811 Chronic cholecystitis: Secondary | ICD-10-CM

## 2012-12-22 DIAGNOSIS — Z9049 Acquired absence of other specified parts of digestive tract: Secondary | ICD-10-CM

## 2012-12-22 DIAGNOSIS — R0989 Other specified symptoms and signs involving the circulatory and respiratory systems: Secondary | ICD-10-CM

## 2012-12-22 DIAGNOSIS — F458 Other somatoform disorders: Secondary | ICD-10-CM

## 2012-12-22 DIAGNOSIS — K589 Irritable bowel syndrome without diarrhea: Secondary | ICD-10-CM

## 2012-12-22 DIAGNOSIS — Z8371 Family history of colonic polyps: Secondary | ICD-10-CM

## 2012-12-22 DIAGNOSIS — Z9889 Other specified postprocedural states: Secondary | ICD-10-CM

## 2012-12-22 DIAGNOSIS — Z83719 Family history of colon polyps, unspecified: Secondary | ICD-10-CM

## 2012-12-22 DIAGNOSIS — K824 Cholesterolosis of gallbladder: Secondary | ICD-10-CM

## 2012-12-22 LAB — VITAMIN B12: Vitamin B-12: 370 pg/mL (ref 211–911)

## 2012-12-22 LAB — IBC PANEL: Iron: 85 ug/dL (ref 42–145)

## 2012-12-22 LAB — FERRITIN: Ferritin: 94.4 ng/mL (ref 10.0–291.0)

## 2012-12-22 MED ORDER — NA SULFATE-K SULFATE-MG SULF 17.5-3.13-1.6 GM/177ML PO SOLN: ORAL | Status: DC

## 2012-12-22 MED ORDER — DEXLANSOPRAZOLE 60 MG PO CPDR: 60.0000 mg | DELAYED_RELEASE_CAPSULE | Freq: Every day | ORAL | Status: DC

## 2012-12-22 NOTE — Patient Instructions (Addendum)
LAPAROSCOPIC SURGERY: POST OP INSTRUCTIONS  1. DIET: Follow a light bland diet the first 24 hours after arrival home, such as soup, liquids, crackers, etc.  Be sure to include lots of fluids daily.  Avoid fast food or heavy meals as your are more likely to get nauseated.  Eat a low fat the next few days after surgery.   2. Take your usually prescribed home medications unless otherwise directed. 3. PAIN CONTROL: a. Pain is best controlled by a usual combination of three different methods TOGETHER: i. Ice/Heat ii. Over the counter pain medication iii. Prescription pain medication b. Most patients will experience some swelling and bruising around the incisions.  Ice packs or heating pads (30-60 minutes up to 6 times a day) will help. Use ice for the first few days to help decrease swelling and bruising, then switch to heat to help relax tight/sore spots and speed recovery.  Some people prefer to use ice alone, heat alone, alternating between ice & heat.  Experiment to what works for you.  Swelling and bruising can take several weeks to resolve.   c. It is helpful to take an over-the-counter pain medication regularly for the first few weeks.  Choose one of the following that works best for you: i. Naproxen (Aleve, etc)  Two 220mg tabs twice a day ii. Ibuprofen (Advil, etc) Three 200mg tabs four times a day (every meal & bedtime) iii. Acetaminophen (Tylenol, etc) 500-650mg four times a day (every meal & bedtime) d. A  prescription for pain medication (such as oxycodone, hydrocodone, etc) should be given to you upon discharge.  Take your pain medication as prescribed.  i. If you are having problems/concerns with the prescription medicine (does not control pain, nausea, vomiting, rash, itching, etc), please call us [phone] to see if we need to switch you to a different pain medicine that will work better for you and/or control your side effect better. ii. If you need a refill on your pain medication,  please contact your pharmacy.  They will contact our office to request authorization. Prescriptions will not be filled after 5 pm or on week-ends. 4. Avoid getting constipated.  Between the surgery and the pain medications, it is common to experience some constipation.  Increasing fluid intake and taking a fiber supplement (such as Metamucil, Citrucel, FiberCon, MiraLax, etc) 1-2 times a day regularly will usually help prevent this problem from occurring.  A mild laxative (prune juice, Milk of Magnesia, MiraLax, etc) should be taken according to package directions if there are no bowel movements after 48 hours.   5. Watch out for diarrhea.  If you have many loose bowel movements, simplify your diet to bland foods & liquids for a few days.  Stop any stool softeners and decrease your fiber supplement.  Switching to mild anti-diarrheal medications (Kayopectate, Pepto Bismol) can help.  If this worsens or does not improve, please call us. 6. Wash / shower every day.  You may shower over the dressings as they are waterproof.  Continue to shower over incision(s) after the dressing is off. 7. Remove your waterproof bandages 5 days after surgery.  You may leave the incision open to air.  You may replace a dressing/Band-Aid to cover the incision for comfort if you wish.  8. ACTIVITIES as tolerated:   a. You may resume regular (light) daily activities beginning the next day-such as daily self-care, walking, climbing stairs-gradually increasing activities as tolerated.  If you can walk 30 minutes without difficulty, it   is safe to try more intense activity such as jogging, treadmill, bicycling, low-impact aerobics, swimming, etc. b. Save the most intensive and strenuous activity for last such as sit-ups, heavy lifting, contact sports, etc  Refrain from any heavy lifting or straining until you are off narcotics for pain control.   c. DO NOT PUSH THROUGH PAIN.  Let pain be your guide: If it hurts to do something, don't do  it.  Pain is your body warning you to avoid that activity for another week until the pain goes down. d. You may drive when you are no longer taking prescription pain medication, you can comfortably wear a seatbelt, and you can safely maneuver your car and apply brakes. e. You may have sexual intercourse when it is comfortable.  9. FOLLOW UP in our office a. Please call [facility] at [phone] to set up an appointment to see your surgeon in the office for a follow-up appointment approximately 2-3 weeks after your surgery. b. Make sure that you call for this appointment the day you arrive home to insure a convenient appointment time. 10. IF YOU HAVE DISABILITY OR FAMILY LEAVE FORMS, BRING THEM TO THE OFFICE FOR PROCESSING.  DO NOT GIVE THEM TO YOUR DOCTOR.   WHEN TO CALL US [phone]: 1. Poor pain control 2. Reactions / problems with new medications (rash/itching, nausea, etc)  3. Fever over 101.5 F (38.5 C) 4. Inability to urinate 5. Nausea and/or vomiting 6. Worsening swelling or bruising 7. Continued bleeding from incision. 8. Increased pain, redness, or drainage from the incision   The clinic staff is available to answer your questions during regular business hours (8:30am-5pm).  Please don't hesitate to call and ask to speak to one of our nurses for clinical concerns.   If you have a medical emergency, go to the nearest emergency room or call 911.  A surgeon from [facility] is always on call at the hospitals   [facility] [address], [address], [place], [place]  [address] ? MAIN: [phone] ? TOLL FREE: [phone] ?  FAX [phone] [email or web address]  GETTING TO GOOD BOWEL HEALTH. Irregular bowel habits such as constipation and diarrhea can lead to many problems over time.  Having one soft bowel movement a day is the most important way to prevent further problems.  The anorectal canal is designed to handle stretching  and feces to safely manage our ability to get rid of solid waste (feces, poop, stool) out of our body.  BUT, hard constipated stools can act like ripping concrete bricks and diarrhea can be a burning fire to this very sensitive area of our body, causing inflamed hemorrhoids, anal fissures, increasing risk is perirectal abscesses, abdominal pain/bloating, an making irritable bowel worse.     The goal: ONE SOFT BOWEL MOVEMENT A DAY!  To have soft, regular bowel movements:    Drink at least 8 tall glasses of water a day.     Take plenty of fiber.  Fiber is the undigested part of plant food that passes into the colon, acting s "natures broom" to encourage bowel motility and movement.  Fiber can absorb and hold large amounts of water. This results in a larger, bulkier stool, which is soft and easier to pass. Work gradually over several weeks up to 6 servings a day of fiber (25g a day even more if needed) in the form of: o Vegetables -- Root (potatoes, carrots, turnips), leafy green (lettuce, salad greens, celery,   spinach), or cooked high residue (cabbage, broccoli, etc) o Fruit -- Fresh (unpeeled skin & pulp), Dried (prunes, apricots, cherries, etc ),  or stewed ( applesauce)  o Whole grain breads, pasta, etc (whole wheat)  o Bran cereals    Bulking Agents -- This type of water-retaining fiber generally is easily obtained each day by one of the following:  o Psyllium bran -- The psyllium plant is remarkable because its ground seeds can retain so much water. This product is available as Metamucil, Konsyl, Effersyllium, Per Diem Fiber, or the less expensive generic preparation in drug and health food stores. Although labeled a laxative, it really is not a laxative.  o Methylcellulose -- This is another fiber derived from wood which also retains water. It is available as Citrucel. o Polyethylene Glycol - and "artificial" fiber commonly called Miralax or Glycolax.  It is helpful for people with gassy or bloated  feelings with regular fiber o Flax Seed - a less gassy fiber than psyllium   No reading or other relaxing activity while on the toilet. If bowel movements take longer than 5 minutes, you are too constipated   AVOID CONSTIPATION.  High fiber and water intake usually takes care of this.  Sometimes a laxative is needed to stimulate more frequent bowel movements, but    Laxatives are not a good long-term solution as it can wear the colon out. o Osmotics (Milk of Magnesia, Fleets phosphosoda, Magnesium citrate, MiraLax, GoLytely) are safer than  o Stimulants (Senokot, Castor Oil, Dulcolax, Ex Lax)    o Do not take laxatives for more than 7days in a row.    IF SEVERELY CONSTIPATED, try a Bowel Retraining Program: o Do not use laxatives.  o Eat a diet high in roughage, such as bran cereals and leafy vegetables.  o Drink six (6) ounces of prune or apricot juice each morning.  o Eat two (2) large servings of stewed fruit each day.  o Take one (1) heaping tablespoon of a psyllium-based bulking agent twice a day. Use sugar-free sweetener when possible to avoid excessive calories.  o Eat a normal breakfast.  o Set aside 15 minutes after breakfast to sit on the toilet, but do not strain to have a bowel movement.  o If you do not have a bowel movement by the third day, use an enema and repeat the above steps.    Controlling diarrhea o Switch to liquids and simpler foods for a few days to avoid stressing your intestines further. o Avoid dairy products (especially milk & ice cream) for a short time.  The intestines often can lose the ability to digest lactose when stressed. o Avoid foods that cause gassiness or bloating.  Typical foods include beans and other legumes, cabbage, broccoli, and dairy foods.  Every person has some sensitivity to other foods, so listen to our body and avoid those foods that trigger problems for you. o Adding fiber (Citrucel, Metamucil, psyllium, Miralax) gradually can help thicken  stools by absorbing excess fluid and retrain the intestines to act more normally.  Slowly increase the dose over a few weeks.  Too much fiber too soon can backfire and cause cramping & bloating. o Probiotics (such as active yogurt, Align, etc) may help repopulate the intestines and colon with normal bacteria and calm down a sensitive digestive tract.  Most studies show it to be of mild help, though, and such products can be costly. o Medicines:   Bismuth subsalicylate (ex. Kayopectate, Pepto Bismol) every   30 minutes for up to 6 doses can help control diarrhea.  Avoid if pregnant.   Loperamide (Immodium) can slow down diarrhea.  Start with two tablets (4mg total) first and then try one tablet every 6 hours.  Avoid if you are having fevers or severe pain.  If you are not better or start feeling worse, stop all medicines and call your doctor for advice o Call your doctor if you are getting worse or not better.  Sometimes further testing (cultures, endoscopy, X-ray studies, bloodwork, etc) may be needed to help diagnose and treat the cause of the diarrhea.  Cholecystitis Cholecystitis is an inflammation of your gallbladder. It is usually caused by a buildup of gallstones or sludge (cholelithiasis) in your gallbladder. The gallbladder stores a fluid that helps digest fats (bile). Cholecystitis is serious and needs treatment right away.  CAUSES  Gallstones. Gallstones can block the tube that leads to your gallbladder, causing bile to build up. As bile builds up, the gallbladder becomes inflamed. Bile duct problems, such as blockage from scarring or kinking. Tumors. Tumors can stop bile from leaving your gallbladder correctly, causing bile to build up. As bile builds up, the gallbladder becomes inflamed. SYMPTOMS  Nausea. Vomiting. Abdominal pain, especially in the upper right area of your abdomen. Abdominal tenderness or bloating. Sweating. Chills. Fever. Yellowing of the skin and the whites of the  eyes (jaundice). DIAGNOSIS  Your caregiver may order blood tests to look for infection or gallbladder problems. Your caregiver may also order imaging tests, such as an ultrasound or computed tomography (CT) scan. Further tests may include a hepatobiliary iminodiacetic acid (HIDA) scan. This scan allows your caregiver to see your bile move from the liver to the gallbladder and to the small intestine. TREATMENT  A hospital stay is usually necessary to lessen the inflammation of your gallbladder. You may be required to not eat or drink (fast) for a certain amount of time. You may be given medicine to treat pain or an antibiotic medicine to treat an infection. Surgery may be needed to remove your gallbladder (cholecystectomy) once the inflammation has gone down. Surgery may be needed right away if you develop complications such as death of gallbladder tissue (gangrene) or a tear (perforation) of the gallbladder.  HOME CARE INSTRUCTIONS  Home care will depend on your treatment. In general: If you were given antibiotics, take them as directed. Finish them even if you start to feel better. Only take over-the-counter or prescription medicines for pain, discomfort, or fever as directed by your caregiver. Follow a low-fat diet until you see your caregiver again. Keep all follow-up visits as directed by your caregiver. SEEK IMMEDIATE MEDICAL CARE IF:  Your pain is increasing and not controlled by medicines. Your pain moves to another part of your abdomen or to your back. You have a fever. You have nausea and vomiting. MAKE SURE YOU: Understand these instructions. Will watch your condition. Will get help right away if you are not doing well or get worse. Document Released: 07/01/2005 Document Revised: 09/23/2011 Document Reviewed: 05/17/2011 [employer] Patient Information 2014 [employer].  

## 2012-12-22 NOTE — Addendum Note (Signed)
Addended by: Ok Anis A on: 12/22/2012 04:34 PM   Modules accepted: Orders

## 2012-12-22 NOTE — Patient Instructions (Addendum)
You have been scheduled for an endoscopy and colonoscopy with propofol. Please follow the written instructions given to you at your visit today. Please pick up your prep at the pharmacy within the next 1-3 days. If you use inhalers (even only as needed), please bring them with you on the day of your procedure. Your physician has requested that you go to www.[email or web address].[email or web address] and enter the access code given to you at your visit today. This web site gives a general overview about your procedure. However, you should still follow specific instructions given to you by our office regarding your preparation for the procedure.  Your physician has requested that you go to the basement for lab work before leaving today.  FOD MAP diet given today for irritable bowel for you to review.

## 2012-12-22 NOTE — Progress Notes (Signed)
History of Present Illness:  This is a 42 year old Caucasian female who recently had laparoscopic cholecystectomy because of acute episode of epigastric abdominal pain with nausea and vomiting in early May of this year..  She is improved, but continues to have IBS type symptoms with alternating diarrhea and constipation without melena or hematochezia.  She's had acid reflux for many years with some nocturnal reflux.  Her main complaint today is a globus sensation in her retropharyngeal area without true dysphagia.  She's been on Dexilant with some improvement, but ran out of her prescription.  She denies a current hepatobiliary or systemic symptoms, history of Raynaud's phenomenon, collagen vascular disease.  Her appetite is good her weight is stable, and she denies any specific food intolerances.  Family history is remarkable for colon polyps and gallbladder disease.  Is no associated skin rashes, joint pains, oral stomatitis etc.  CT scan of the abdomen was reviewed and was otherwise unremarkable.  I have reviewed this patient's present history, medical and surgical past history, allergies and medications.     ROS:   All systems were reviewed and are negative unless otherwise stated in the HPI.    Physical Exam: At pressure 120/86, pulse 74 and regular, and weight 166 with a BMI of 29.41.  I cannot appreciate stigmata of chronic liver disease or thyromegaly or lymphadenopathy in her neck. General well developed well nourished patient in no acute distress, appearing their stated age Eyes PERRLA, no icterus, fundoscopic exam per opthamologist Skin no lesions noted Neck supple, no adenopathy, no thyroid enlargement, no tenderness Chest clear to percussion and auscultation Heart no significant murmurs, gallops or rubs noted Abdomen no hepatosplenomegaly masses or tenderness, BS normal. . Extremities no acute joint lesions, edema, phlebitis or evidence of cellulitis. Neurologic patient oriented x 3,  cranial nerves intact, no focal neurologic deficits noted. Psychological mental status normal and normal affect.  Assessment and plan: Chronic GERD, rule out Barrett's mucosa.  I think her globus sensation is extra esophageal manifestation of acid reflux.  Every placed her on Dexilant 60 mg a day, have scheduled endoscopic exam.  She has rather classic IBS with alternating diarrhea and constipation, and I placed her on a FOD-MAP diet, schedule colonoscopy, and we'll check celiac profile and anemia profile.  Also given her information concerning dietary factors and IBS.  She has appointment to see Dr. Michaell Cowing and surgical followup today.  No diagnosis found.

## 2012-12-22 NOTE — Addendum Note (Signed)
Addended by: Ok Anis A on: 12/22/2012 04:28 PM   Modules accepted: Orders

## 2012-12-22 NOTE — Progress Notes (Signed)
Subjective:     Patient ID: Brittney Townsend, female   DOB: April 30, 1971, 42 y.o.   MRN: [id number]  HPI  Brittney Townsend  08-27-1970 [id number]  Patient Care Team: Bradd Canary, MD as PCP - General (Family Medicine)  This patient is a 42 y.o.female who presents today for surgical evaluation Status post single site laparoscopic cholecystectomy.  Pathology consistent with chronic cholecystitis, cholesterolosis, mucosal excrescence (copy given to patient)  The patient comes in today feeling well.  No nausea or vomiting.  Some decreased appetite but improving.  Soreness going down.  In good spirits.  No fevers or chills.  Daily bowel movements.  No bleeding or diarrhea.  Patient Active Problem List   Diagnosis Date Noted  . Mucosal excresence of gallbladder 11/24/2012  . Chronic cholecystitis 11/24/2012  . Insomnia 09/26/2012  . Otitis media 05/09/2012  . Allergic state 10/24/2011  . Elevated BP 10/24/2011  . 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 Hyperlipidemia 05/01/2011  . Breast cyst 02/08/2011  . Vaginitis and vulvovaginitis 12/24/2010  . Knee pain, left 12/12/2010  . Sinusitis acute 12/12/2010  . Environmental allergies 12/12/2010  . Anxiety disorder 11/02/2010  . Preventative health care 10/29/2010  . MIGRAINE HEADACHE 09/28/2010  . GERD 09/28/2010  . 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 Other and unspecified ovarian cyst 09/28/2010  . CHEST PAIN, ATYPICAL 09/28/2010    Past Medical History  Diagnosis Date  . Allergy     seasonal  . Anxiety   . Migraines   . Overweight(278.02) 09/28/2010  . Other and unspecified ovarian cyst 09/28/2010  . MIGRAINE HEADACHE 09/28/2010  . 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 HYPERTRIGLYCERIDEMIA 09/28/2010  . GERD 09/28/2010  . CHEST PAIN, ATYPICAL 09/28/2010  . ANXIETY DISORDER 09/28/2010  . Anxiety disorder 11/02/2010  . Knee pain, left 12/12/2010  . Sinusitis acute 12/12/2010  . Environmental allergies 12/12/2010  . Vaginitis and  vulvovaginitis 12/24/2010  . Acute epigastric pain 04/15/2011  . Hyperlipidemia 05/01/2011  . Allergic state 10/24/2011  . Elevated BP 10/24/2011  . Otitis media 05/09/2012  . Insomnia 09/26/2012    Past Surgical History  Procedure Laterality Date  . Abdominal hysterectomy  2008    total  . Back surgery  2009    L5 fracture required pins, rod, fusion L4-L5 fusion  . Elbow surgery  2011    carpal tunnel release on right  . Cesarean section  1998, 2003  . Cholecystectomy      History   Social History  . Marital Status: Married    Spouse Name: N/A    Number of Children: N/A  . Years of Education: N/A   Occupational History  . Not on file.   Social History Main Topics  . Smoking status: Never Smoker   . Smokeless tobacco: Never Used  . Alcohol Use: Yes     Comment: rare, special occasions  . Drug Use: No  . Sexually Active: Yes -- Female partner(s)   Other Topics Concern  . Not on file   Social History Narrative  . No narrative on file    Family History  Problem Relation Age of Onset  . Hypertension Mother   . Hyperlipidemia Father   . Diabetes Father     type 2  . Cancer Maternal Grandmother     Lung/ previous smoker  . Hypertension Maternal Grandmother   . Hyperlipidemia Maternal Grandmother   . Other Maternal Grandfather     renal failure  . Heart disease Maternal Grandfather   .  Hypertension Maternal Grandfather   . Hyperlipidemia Maternal Grandfather   . Colon polyps Maternal Grandfather   . Diabetes Paternal Grandmother   . Hypertension Paternal Grandmother   . Hyperlipidemia Paternal Grandmother   . Hypertension Paternal Grandfather   . Hyperlipidemia Paternal Grandfather   . Cancer Paternal Grandfather     skin  . Depression Brother   . Obesity Son   . Heart attack Paternal Aunt     smoker  . Heart attack Paternal Uncle     smoker    Current Outpatient Prescriptions  Medication Sig Dispense Refill  . ALPRAZolam (XANAX) 0.25 MG tablet 1/2 to  1 tablet by mouth twice daily as needed.  60 tablet  2  . cetirizine (ZYRTEC) 10 MG tablet 1 tab twice daily as needed for congestion  30 tablet  11  . DEXILANT 60 MG capsule TAKE 1 CAPSULE BY MOUTH EVERY DAY  30 capsule  5  . famotidine (PEPCID) 20 MG tablet Take 1 tablet (20 mg total) by mouth 2 (two) times daily.  28 tablet  0  . Multiple Vitamin (MULTIVITAMIN) tablet Take 1 tablet by mouth daily.      Marland Kitchen PREMARIN 1.25 MG tablet TAKE 1 TABLET (1.25 MG TOTAL) BY MOUTH DAILY.  30 tablet  3  . Probiotic Product (PRO-FLORA CONCENTRATE) CAPS Take 1 capsule by mouth daily.        . SUMAtriptan (IMITREX) 100 MG tablet Take 100 mg by mouth every 2 (two) hours as needed.        . SUPER BIOTIN PO Take by mouth.      . traZODone (DESYREL) 50 MG tablet Take 1 tablet (50 mg total) by mouth at bedtime.  30 tablet  5  . URELLE (URELLE/URISED) 81 MG TABS Take 1 tablet (81 mg total) by mouth 3 (three) times daily as needed.  90 each  5  . fluticasone (FLONASE) 50 MCG/ACT nasal spray Place 2 sprays into the nose daily as needed for rhinitis or allergies.  16 g  6   No current facility-administered medications for this visit.     Allergies  Allergen Reactions  . Sulfonamide Derivatives     REACTION: hives    BP 118/78  Pulse 64  Temp(Src) 97.8 F (36.6 C) (Temporal)  Resp 16  Ht 5' 3.5" (1.613 m)  Wt 165 lb 3.2 oz (74.934 kg)  BMI 28.8 kg/m2  Ct Abdomen Pelvis W Contrast  11/23/2012   *RADIOLOGY REPORT*  Clinical Data: Lower abdominal pain, right side greater than left. Nausea and vomiting.  Diarrhea.  CT ABDOMEN AND PELVIS WITH CONTRAST  Technique:  Multidetector CT imaging of the abdomen and pelvis was performed following the standard protocol during bolus administration of intravenous contrast.  Contrast: OMNIPAQUE IOHEXOL 300 MG/ML  SOLN  Comparison: Noncontrast CT on 09/05/2006  Findings: Gallbladder is unremarkable in appearance there is no evidence of biliary dilatation.  The liver,  spleen, pancreas, adrenal glands, and kidneys are normal in appearance.  No evidence of hydronephrosis.  No soft tissue masses or lymphadenopathy identified within the abdomen or pelvis.  Prior hysterectomy noted.  Adnexal regions are unremarkable.  No evidence of inflammatory process or abnormal fluid collections. No evidence of bowel wall thickening, dilatation, or hernia.  IMPRESSION: Negative.  No acute findings or other significant abnormality identified.   Original Report Authenticated By: Myles Rosenthal, M.D.     Review of Systems  Constitutional: Negative for fever, chills and diaphoresis.  HENT: Negative for  ear pain, sore throat and trouble swallowing.   Eyes: Negative for photophobia and visual disturbance.  Respiratory: Negative for cough and choking.   Cardiovascular: Negative for chest pain and palpitations.  Gastrointestinal: Negative for nausea, vomiting, abdominal pain, diarrhea, constipation, anal bleeding and rectal pain.  Genitourinary: Negative for dysuria, frequency and difficulty urinating.  Musculoskeletal: Negative for myalgias and gait problem.  Skin: Negative for color change, pallor and rash.  Neurological: Negative for dizziness, speech difficulty, weakness and numbness.  Hematological: Negative for adenopathy.  Psychiatric/Behavioral: Negative for confusion and agitation. The patient is not nervous/anxious.        Objective:   Physical Exam  Constitutional: She is oriented to person, place, and time. She appears well-developed and well-nourished. No distress.  HENT:  Head: Normocephalic.  Mouth/Throat: Oropharynx is clear and moist. No oropharyngeal exudate.  Eyes: Conjunctivae and EOM are normal. Pupils are equal, round, and reactive to light. No scleral icterus.  Neck: Normal range of motion. No tracheal deviation present.  Cardiovascular: Normal rate and intact distal pulses.   Pulmonary/Chest: Effort normal. No respiratory distress. She exhibits no  tenderness.  Abdominal: Soft. She exhibits no distension. There is no tenderness. Hernia confirmed negative in the right inguinal area and confirmed negative in the left inguinal area.  Incisions clean with normal healing ridges.  No hernias  Genitourinary: No vaginal discharge found.  Musculoskeletal: Normal range of motion. She exhibits no tenderness.  Lymphadenopathy:       Right: No inguinal adenopathy present.       Left: No inguinal adenopathy present.  Neurological: She is alert and oriented to person, place, and time. No cranial nerve deficit. She exhibits normal muscle tone. Coordination normal.  Skin: Skin is warm and dry. No rash noted. She is not diaphoretic.  Psychiatric: She has a normal mood and affect. Her behavior is normal.       Assessment:     Recovering well status post cholecystectomy for chronic cholecystitis and mucosal irregularity.  No evidence of cancer.     Plan:     Increase activity as tolerated to regular activity.  Low impact exercise such as walking an hour a day at least ideal.  Do not push through pain.  Diet as tolerated.  Low fat high fiber diet ideal.  Bowel regimen with 30 g fiber a day and fiber supplement as needed to avoid problems.  Return to clinic as needed.   Instructions discussed.  Followup with primary care physician for other health issues as would normally be done.  Questions answered.  The patient expressed understanding and appreciation

## 2012-12-23 ENCOUNTER — Telehealth: Payer: Self-pay | Admitting: Gastroenterology

## 2012-12-23 LAB — CELIAC PANEL 10
Gliadin IgG: 4.6 U/mL (ref <20)
Tissue Transglut Ab: 6.9 U/mL (ref <20)
Tissue Transglutaminase Ab, IgA: 3.5 U/mL (ref <20)

## 2012-12-23 NOTE — Telephone Encounter (Signed)
lmom for pt to call back

## 2012-12-23 NOTE — Telephone Encounter (Signed)
Pt states she can't afford to have both procedures done now. Informed her of NPO status after 7am for EGD; pt stated understanding.

## 2012-12-28 ENCOUNTER — Telehealth: Payer: Self-pay

## 2012-12-28 ENCOUNTER — Other Ambulatory Visit: Payer: Self-pay | Admitting: Family Medicine

## 2012-12-28 NOTE — Telephone Encounter (Signed)
PA for Dexilant sent  3 boxes of samples in front cabinet for pt to pick up. Pt informed

## 2012-12-30 ENCOUNTER — Encounter: Payer: Self-pay | Admitting: Gastroenterology

## 2012-12-30 ENCOUNTER — Other Ambulatory Visit: Payer: Self-pay | Admitting: *Deleted

## 2012-12-30 ENCOUNTER — Telehealth: Payer: Self-pay | Admitting: *Deleted

## 2012-12-30 ENCOUNTER — Ambulatory Visit (AMBULATORY_SURGERY_CENTER): Payer: BC Managed Care – PPO | Admitting: Gastroenterology

## 2012-12-30 ENCOUNTER — Encounter: Payer: BC Managed Care – PPO | Admitting: Gastroenterology

## 2012-12-30 VITALS — BP 127/75 | HR 81 | Temp 97.8°F | Resp 16 | Ht 63.5 in | Wt 166.0 lb

## 2012-12-30 DIAGNOSIS — R0989 Other specified symptoms and signs involving the circulatory and respiratory systems: Secondary | ICD-10-CM

## 2012-12-30 DIAGNOSIS — K589 Irritable bowel syndrome without diarrhea: Secondary | ICD-10-CM

## 2012-12-30 DIAGNOSIS — K219 Gastro-esophageal reflux disease without esophagitis: Secondary | ICD-10-CM

## 2012-12-30 DIAGNOSIS — F458 Other somatoform disorders: Secondary | ICD-10-CM

## 2012-12-30 MED ORDER — SODIUM CHLORIDE 0.9 % IV SOLN: 500.0000 mL | INTRAVENOUS | Status: DC

## 2012-12-30 MED ORDER — DEXLANSOPRAZOLE 60 MG PO CPDR: 60.0000 mg | DELAYED_RELEASE_CAPSULE | Freq: Every day | ORAL | Status: DC

## 2012-12-30 NOTE — Op Note (Signed)
[facility] [address] [place], [address]   ENDOSCOPY PROCEDURE REPORT  PATIENT: Brittney Townsend, Brittney Townsend  MR#: [id number] BIRTHDATE: 1971/03/12 , 41  yrs. old GENDER: Female ENDOSCOPIST:David Hale Bogus, MD, Clementeen Graham REFERRED BY: Reuel Derby, M.D. PROCEDURE DATE:  12/30/2012 PROCEDURE:   EGD w/ biopsy for H.pylori and EGD w/ biopsy ASA CLASS:    Class II INDICATIONS: globus sensation. MEDICATION: propofol (Diprivan) 300mg  IV TOPICAL ANESTHETIC:  DESCRIPTION OF PROCEDURE:   After the risks and benefits of the procedure were explained, informed consent was obtained.  The LB [id number]  endoscope was introduced through the mouth  and advanced to the second portion of the duodenum .  The instrument was slowly withdrawn as the mucosa was fully examined.      DUODENUM: The duodenal mucosa showed no abnormalities in the bulb and second portion of the duodenum.  STOMACH: The mucosa of the stomach appeared normal. CLO Bx. done.  ESOPHAGUS: The mucosa of the esophagus appeared normal.  Multiple biopsies were performed.    Retroflexed views revealed a wide hiatal hernia. See pictures   The scope was then withdrawn from the patient and the procedure completed.  COMPLICATIONS: There were no complications.   ENDOSCOPIC IMPRESSION: 1.   The duodenal mucosa showed no abnormalities in the bulb and second portion of the duodenum 2.   The mucosa of the stomach appeared normal 3.   The mucosa of the esophagus appeared normal; multiple biopsies,r/o eosinophilic esophagitis,Barrett's mucosa. 4 . Hiatial hernia and probable chronic GERD RECOMMENDATIONS: 1.  Continue PPI 2.  Await pathology results 3. Consider high resolution manometry    _______________________________ eSigned:  Mardella Layman, MD, [facility] 12/30/2012 11:08 AM      PATIENT NAME:  Brittney Townsend, Brittney Townsend MR#: [id number]

## 2012-12-30 NOTE — Telephone Encounter (Signed)
[facility] from [facility] patient needs prior auth done for Dexilant  I called [phone] [employer])

## 2012-12-30 NOTE — Telephone Encounter (Signed)
I was faxed a form for prior auth Filled form out and faxed back  Patient was here today for a procedure, so I gave patient samples because patient said there might be a problem with getting Dexilant due to prior auth needing to be done.  Per patient she has tried Nexium and Pepcid

## 2012-12-30 NOTE — Progress Notes (Signed)
Called to room to assist during endoscopic procedure.  Patient ID and intended procedure confirmed with present staff. Received instructions for my participation in the procedure from the performing physician.  

## 2012-12-30 NOTE — Progress Notes (Signed)
Patient did not have preoperative order for IV antibiotic SSI prophylaxis. (G8918)  Patient did not experience any of the following events: a burn prior to discharge; a fall within the facility; wrong site/side/patient/procedure/implant event; or a hospital transfer or hospital admission upon discharge from the facility. (G8907)  

## 2012-12-30 NOTE — Patient Instructions (Addendum)

## 2012-12-31 ENCOUNTER — Telehealth: Payer: Self-pay | Admitting: *Deleted

## 2012-12-31 LAB — HELICOBACTER PYLORI SCREEN-BIOPSY: UREASE: NEGATIVE

## 2012-12-31 NOTE — Telephone Encounter (Signed)
Received paperwork stating patients Dexilant is approved from 12-29-12 through 07-14-38

## 2012-12-31 NOTE — Telephone Encounter (Signed)
  Follow up Call-  Call back number 12/30/2012  Post procedure Call Back phone  # [phone]  Permission to leave phone message Yes     Patient questions:  Do you have a fever, pain , or abdominal swelling? no Pain Score  0 *  Have you tolerated food without any problems? yes  Have you been able to return to your normal activities? yes  Do you have any questions about your discharge instructions: Diet   no Medications  no Follow up visit  no  Do you have questions or concerns about your Care? no  Actions: * If pain score is 4 or above: 0 No action needed, pain <4.

## 2012-12-31 NOTE — Telephone Encounter (Signed)
Pt informed

## 2013-01-05 ENCOUNTER — Encounter: Payer: Self-pay | Admitting: Gastroenterology

## 2013-01-12 ENCOUNTER — Telehealth: Payer: Self-pay | Admitting: Gastroenterology

## 2013-01-12 NOTE — Telephone Encounter (Signed)
Pt reports she is still having the same trouble. She states Dr Jarold Motto stated he could refer her to an ENT, but I only find where he mentioned the Esophageal Man. I will call her tomorrow and refer her to an ENT and if nothing is found, we can schedule the EM. Pt states her problem is only on the left side; burning and a globus sensation on that side.

## 2013-01-13 NOTE — Telephone Encounter (Signed)
The message below was written on the wrong pt, please disregard

## 2013-01-13 NOTE — Telephone Encounter (Signed)
I don't see mention of ENT referral anywhere.  She has a globus sensation in need high-resolution esophageal manometry.

## 2013-01-13 NOTE — Telephone Encounter (Signed)
Dr Jarold Motto, you requested an Esophageal Manometry, but pt states you all discussed an ENT referral; Ok to try the ENT and if nothing is found, do the manometry? Thanks.

## 2013-01-13 NOTE — Telephone Encounter (Signed)
Spoke with pt about her possible pill cam. Pt is very reluctant to do another prep d/t she had a horrible experience with Miralax. Informed her when she see Dr Jarold Motto on 01/22/13, one of the nurses will show her the pill and ask Dr Jarold Motto if we can do a smaller prep such as mag citrate, Su Prep or Prep o Pic. Pt stated understanding.

## 2013-01-18 ENCOUNTER — Telehealth: Payer: Self-pay | Admitting: Gastroenterology

## 2013-01-18 NOTE — Telephone Encounter (Signed)
Informed pt Dr Jarold Motto prefers she have an EM instead of an ENT referral. Pt is scheduled for 01/25/13 at 0800am; mailed instructions and pt will call if not received by Friday.

## 2013-01-18 NOTE — Telephone Encounter (Signed)
Mardella Layman, MD at 01/13/2013 12:18 PM   Status: Signed            I don't see mention of ENT referral anywhere. She has a globus sensation in need high-resolution esophageal manometry.   lmom for pt to call back.

## 2013-01-21 ENCOUNTER — Telehealth: Payer: Self-pay | Admitting: Gastroenterology

## 2013-01-21 NOTE — Telephone Encounter (Signed)
R/S EM to 02/08/13 at 0800am.

## 2013-02-08 ENCOUNTER — Encounter (HOSPITAL_COMMUNITY)
Admission: RE | Disposition: A | Discharge status: Home / Self Care | Payer: Self-pay | Source: Ambulatory Visit | Attending: Gastroenterology

## 2013-02-08 ENCOUNTER — Encounter (HOSPITAL_COMMUNITY): Payer: Self-pay | Admitting: *Deleted

## 2013-02-08 ENCOUNTER — Ambulatory Visit (HOSPITAL_COMMUNITY)
Admission: RE | Admit: 2013-02-08 | Discharge: 2013-02-08 | Disposition: A | Discharge status: Home / Self Care | Payer: BC Managed Care – PPO | Source: Ambulatory Visit | Attending: Gastroenterology | Admitting: Gastroenterology

## 2013-02-08 DIAGNOSIS — R6889 Other general symptoms and signs: Secondary | ICD-10-CM | POA: Insufficient documentation

## 2013-02-08 HISTORY — PX: ESOPHAGEAL MANOMETRY: SHX5429

## 2013-02-08 SURGERY — MANOMETRY, ESOPHAGUS
Anesthesia: Topical

## 2013-02-08 MED ORDER — LIDOCAINE VISCOUS 2 % MT SOLN
OROMUCOSAL | Status: AC
  Filled 2013-02-08: qty 15

## 2013-02-08 SURGICAL SUPPLY — 2 items
DRAPE UTILITY 15X26 W/TAPE STR (DRAPE) ×2 IMPLANT
GOWN PREVENTION PLUS LG XLONG (DISPOSABLE) ×2 IMPLANT

## 2013-02-09 ENCOUNTER — Telehealth: Payer: Self-pay | Admitting: *Deleted

## 2013-02-09 ENCOUNTER — Encounter (HOSPITAL_COMMUNITY): Payer: Self-pay | Admitting: Gastroenterology

## 2013-02-09 NOTE — Telephone Encounter (Signed)
Patient called back and I advised her that her esophageal manometry was normal and to stay on her acid reflux medication Patient verbalized understanding and said she has a follow up visit from her procedure on 8-19.

## 2013-02-09 NOTE — Telephone Encounter (Signed)
I called patient to let them know that the esophageal manometry was normal and to stay on acid reflux medication Had to leave message for patient to call back

## 2013-02-23 ENCOUNTER — Telehealth: Payer: Self-pay

## 2013-02-23 MED ORDER — CIPROFLOXACIN HCL 500 MG PO TABS: 500.0000 mg | ORAL_TABLET | Freq: Two times a day (BID) | ORAL | Status: AC

## 2013-02-23 MED ORDER — CLINDAMYCIN PHOS-BENZOYL PEROX 1-5 % EX GEL: Freq: Every evening | CUTANEOUS | Status: AC | PRN

## 2013-02-23 NOTE — Telephone Encounter (Signed)
OK to rx Benzaclin apply to lesions qhs prn, disp same quantity with 2 rf. Can have the Ciprofloxacin 500 mg po bid x 10 days I gave her before but would encourage her to try the cream for a week first to see if it is enough. Should start a probiotic as well

## 2013-02-23 NOTE — Telephone Encounter (Signed)
RX's sent to pharmacy.   Left a message for patient to return my call

## 2013-02-23 NOTE — Telephone Encounter (Signed)
Pt left a message stating that she would like MD to send in the acne medication that she has used in the [facility]? Pt states she has the hard knots under skin around her chin and nose again. Pt also stated she thought MD sent in an antibiotic also with this last time?  Please advise both?

## 2013-03-02 ENCOUNTER — Ambulatory Visit: Payer: BC Managed Care – PPO | Admitting: Gastroenterology

## 2013-03-12 ENCOUNTER — Encounter: Payer: Self-pay | Admitting: *Deleted

## 2013-03-23 ENCOUNTER — Encounter: Payer: Self-pay | Admitting: Gastroenterology

## 2013-03-23 ENCOUNTER — Ambulatory Visit (INDEPENDENT_AMBULATORY_CARE_PROVIDER_SITE_OTHER): Payer: BC Managed Care – PPO | Admitting: Gastroenterology

## 2013-03-23 VITALS — BP 166/88 | HR 100 | Ht 62.5 in | Wt 162.1 lb

## 2013-03-23 DIAGNOSIS — R141 Gas pain: Secondary | ICD-10-CM

## 2013-03-23 DIAGNOSIS — K219 Gastro-esophageal reflux disease without esophagitis: Secondary | ICD-10-CM

## 2013-03-23 DIAGNOSIS — R0989 Other specified symptoms and signs involving the circulatory and respiratory systems: Secondary | ICD-10-CM

## 2013-03-23 DIAGNOSIS — R140 Abdominal distension (gaseous): Secondary | ICD-10-CM

## 2013-03-23 DIAGNOSIS — F458 Other somatoform disorders: Secondary | ICD-10-CM

## 2013-03-23 NOTE — Patient Instructions (Signed)
Please follow up with Dr. Jarold Motto in six months   New prescription for Dexilant was sent to your pharmacy  Information on Artificial Sweeteners was given today for your review

## 2013-03-23 NOTE — Progress Notes (Signed)
This is a 42 year old Caucasian female who is much improved in terms of her globus sensation for which she is taking Dexilant 60 mg a day.  She continues to complain of some abdominal bloating but otherwise denies GI problems.  Recent esophageal manometry was completed which was entirely normal.  Previous endoscopy showing a small hiatal hernia with some evidence of acid reflux.  Currently, she denies dysphagia, chest pain, or globus sensation.  Her appetite is good her weight is stable.  She denies any hepatobiliary complaints.  There also is no history of lactose intolerance, but she does occasionally use nonabsorbable carbohydrates in her diet.  Current Medications, Allergies, Past Medical History, Past Surgical History, Family History and Social History were reviewed in [employer] record.  ROS: All systems were reviewed and are negative unless otherwise stated in the HPI.          Physical Exam: Blood pressure 166/88, pulse 100 and weight 162 with a BMI of 29.16.  Examination oropharynx is unremarkable.  There is no thyromegaly or lymphadenopathy her neck area.  Abdominal exam shows no distention, organomegaly, masses or tenderness.  Bowel sounds are normal.  Mental status is normal    Assessment and Plan: Chronic GERD doing well on daily Dexilant 60 mg.  Her globus sensation seemed to have finally resolved.  Manometry showed no evidence of an esophageal motility disorder.  Reflux maneuvers reviewed with patient, and we'll continue current therapy with office followup in 6 months or when necessary as needed.  I have given her a list of nonabsorbable carbohydrates in her diet to avoid

## 2013-04-02 ENCOUNTER — Telehealth: Payer: Self-pay

## 2013-04-02 MED ORDER — METRONIDAZOLE 500 MG PO TABS: 500.0000 mg | ORAL_TABLET | Freq: Three times a day (TID) | ORAL | Status: DC

## 2013-04-02 NOTE — Telephone Encounter (Signed)
Patient left a message stating that she would like some [facility]. Pt stated in message that she has burning, discomfort and odor?  Please advise if this can be sent or patient needs to make an OV?

## 2013-04-02 NOTE — Telephone Encounter (Signed)
Patient informed and voiced understanding.  RX sent 

## 2013-04-02 NOTE — Telephone Encounter (Signed)
She can have a 5 day course of Flagyl if she has found it helpful 500 mg po tid #15, if no improvement needs to come for check

## 2013-04-21 ENCOUNTER — Other Ambulatory Visit: Payer: Self-pay | Admitting: Family Medicine

## 2013-04-21 NOTE — Telephone Encounter (Signed)
Rx request to pharmacy, #[id number]; PATIENT DUE FOR FOLLOW-UP OFFICE VISIT/SLS    

## 2013-05-03 ENCOUNTER — Encounter: Payer: Self-pay | Admitting: Family Medicine

## 2013-05-03 ENCOUNTER — Ambulatory Visit (INDEPENDENT_AMBULATORY_CARE_PROVIDER_SITE_OTHER): Payer: BC Managed Care – PPO | Admitting: Family Medicine

## 2013-05-03 VITALS — BP 129/85 | HR 74 | Temp 98.5°F | Resp 18 | Ht 63.0 in | Wt 164.0 lb

## 2013-05-03 DIAGNOSIS — Z23 Encounter for immunization: Secondary | ICD-10-CM

## 2013-05-03 DIAGNOSIS — M545 Low back pain: Secondary | ICD-10-CM

## 2013-05-03 MED ORDER — CELECOXIB 200 MG PO CAPS: 200.0000 mg | ORAL_CAPSULE | Freq: Two times a day (BID) | ORAL | Status: DC

## 2013-05-03 MED ORDER — CELECOXIB 200 MG PO CAPS: 200.0000 mg | ORAL_CAPSULE | Freq: Every day | ORAL | Status: DC

## 2013-05-03 MED ORDER — HYDROCODONE-ACETAMINOPHEN 5-325 MG PO TABS: ORAL_TABLET | ORAL | Status: DC

## 2013-05-03 MED ORDER — CYCLOBENZAPRINE HCL 10 MG PO TABS: 10.0000 mg | ORAL_TABLET | Freq: Three times a day (TID) | ORAL | Status: DC | PRN

## 2013-05-03 NOTE — Assessment & Plan Note (Signed)
Home stretches as per her previous PT--pt has these handy and is well versed in how to do this. Celebrex 200mg  qd : samples x 12 given. Vicodin 5/325, 1-2 q6h prn, #40, no RF. Flexeril 10mg  tid prn, #40, no RF

## 2013-05-03 NOTE — Progress Notes (Signed)
OFFICE NOTE  05/03/2013  CC:  Chief Complaint  Patient presents with  . Back Pain    x Thursday      HPI: Patient is a 42 y.o. Caucasian female who is here for back pain.  Diffuse low back region, onset 4 days ago, bad achiness with spasms, hard to stand up straight--intensity about 7/10 currently.  Better when lying flat. Has recently had long car ride (6 hours).  No trauma to back. Pain radiates into buttocks, hips, and lateral thighs.  No tingling or numbness anywhere, no LE weakness, no loss of bowel or bladder control. Anti-inflammitory med regularly last few days + heat and aspercream no help.  Had L-spine fusion 2009 and has only had occ mild flare of LBP since then.  Pertinent PMH:  Past Medical History  Diagnosis Date  . Allergy     seasonal  . Overweight(278.02) 09/28/2010  . Other and unspecified ovarian cyst 09/28/2010  . MIGRAINE HEADACHE 09/28/2010  . 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 HYPERTRIGLYCERIDEMIA 09/28/2010  . GERD 09/28/2010  . CHEST PAIN, ATYPICAL 09/28/2010  . ANXIETY DISORDER 09/28/2010  . Anxiety disorder 11/02/2010  . Knee pain, left 12/12/2010  . Sinusitis acute 12/12/2010  . Environmental allergies 12/12/2010  . Vaginitis and vulvovaginitis 12/24/2010  . Acute epigastric pain 04/15/2011  . Hyperlipidemia 05/01/2011  . Allergic state 10/24/2011  . Elevated BP 10/24/2011  . Otitis media 05/09/2012  . Insomnia 09/26/2012   Past Surgical History  Procedure Laterality Date  . Abdominal hysterectomy  2008    total  . Back surgery  2009    L5 spondylolysis with spondylolisthesis, with bilat L5 nerve root compression:  required pins, rod, fusion.  . Elbow surgery  2011    carpal tunnel release on right+ ulnar decompression on right  . Cesarean section  1998, 2003  . Cholecystectomy    . Esophageal manometry N/A 02/08/2013    Procedure: ESOPHAGEAL MANOMETRY (EM);  Surgeon: Mardella Layman, MD;  Location: [facility];  Service: Endoscopy;  Laterality:  N/A;    MEDS:  Outpatient Prescriptions Prior to Visit  Medication Sig Dispense Refill  . ALPRAZolam (XANAX) 0.25 MG tablet 1/2 to 1 tablet by mouth twice daily as needed.  60 tablet  2  . cetirizine (ZYRTEC) 10 MG tablet 1 tab twice daily as needed for congestion  30 tablet  11  . clindamycin-benzoyl peroxide (BENZACLIN) gel Apply topically at bedtime as needed.  25 g  2  . dexlansoprazole (DEXILANT) 60 MG capsule Take 1 capsule (60 mg total) by mouth daily.  30 capsule  9  . Multiple Vitamin (MULTIVITAMIN) tablet Take 1 tablet by mouth daily.      Marland Kitchen PREMARIN 1.25 MG tablet TAKE 1 TABLET (1.25 MG TOTAL) BY MOUTH DAILY.  30 tablet  0  . Probiotic Product (PRO-FLORA CONCENTRATE) CAPS Take 1 capsule by mouth daily.        . SUMAtriptan (IMITREX) 100 MG tablet Take 100 mg by mouth every 2 (two) hours as needed.        . traZODone (DESYREL) 50 MG tablet Take 1 tablet (50 mg total) by mouth at bedtime.  30 tablet  5  . URELLE (URELLE/URISED) 81 MG TABS Take 1 tablet (81 mg total) by mouth 3 (three) times daily as needed.  90 each  5  . fluticasone (FLONASE) 50 MCG/ACT nasal spray Place 2 sprays into the nose daily as needed for rhinitis or allergies.  16 g  6  .  metroNIDAZOLE (FLAGYL) 500 MG tablet Take 1 tablet (500 mg total) by mouth 3 (three) times daily.  15 tablet  0   No facility-administered medications prior to visit.    PE: Blood pressure 129/85, pulse 74, temperature 98.5 F (36.9 C), temperature source Temporal, resp. rate 18, height 5\' 3"  (1.6 m), weight 164 lb (74.39 kg), SpO2 99.00%. Gen: Alert, well appearing.  Patient is oriented to person, place, time, and situation. CV: RRR, no m/r/g.   LUNGS: CTA bilat, nonlabored resps, good aeration in all lung fields. BACK: ROM intact but pain with flexion and lateral bending.  Mild diffuse L-spine and S-spine TTP.  I can't feel any muscle spasm. Sitting SLR neg bilat.  LE strength 5/5 prox and dist bilat. LE DTRs: intact and  symmetric.  IMPRESSION AND PLAN:  Acute mechanical low back pain with duration of less than six weeks Home stretches as per her previous PT--pt has these handy and is well versed in how to do this. Celebrex 200mg  qd : samples x 12 given. Vicodin 5/325, 1-2 q6h prn, #40, no RF. Flexeril 10mg  tid prn, #40, no RF   Flu vaccine IM today.  An After Visit Summary was printed and given to the patient.  FOLLOW UP: 7-10d if not improving any

## 2013-05-27 ENCOUNTER — Other Ambulatory Visit: Payer: Self-pay | Admitting: Family Medicine

## 2013-06-18 ENCOUNTER — Telehealth: Payer: Self-pay | Admitting: *Deleted

## 2013-06-18 DIAGNOSIS — F411 Generalized anxiety disorder: Secondary | ICD-10-CM

## 2013-06-18 MED ORDER — ALPRAZOLAM 0.25 MG PO TABS: ORAL_TABLET | ORAL | Status: DC

## 2013-06-18 NOTE — Telephone Encounter (Signed)
RX sent.  Call pt on Monday to inform

## 2013-06-18 NOTE — Telephone Encounter (Signed)
OK to refill Alprazolam same strength, same sig #60 with no refills and warn her she needs an appt to get more

## 2013-06-18 NOTE — Telephone Encounter (Signed)
[facility]Scribe request for refill on Alprazolam Last filled - 03.14.14, #60x2 Last AEX - 05.12.14 Next AEX - 4 Weeks [Pt has only been in w/Dr. Milinda Cave for Acute 10.20.14] Please Advise/SLS

## 2013-07-01 ENCOUNTER — Other Ambulatory Visit: Payer: Self-pay | Admitting: Family Medicine

## 2013-07-23 ENCOUNTER — Telehealth: Payer: Self-pay | Admitting: Family Medicine

## 2013-07-23 MED ORDER — FLUTICASONE PROPIONATE 50 MCG/ACT NA SUSP: 2.0000 | Freq: Every day | NASAL | Status: DC | PRN

## 2013-07-23 NOTE — Telephone Encounter (Signed)
refill-fluconazole 150mg  tab. Take one tablet by mouth every week as directed. Qty 2 last fill 10.9.14

## 2013-07-27 ENCOUNTER — Encounter: Payer: Self-pay | Admitting: Family Medicine

## 2013-07-27 ENCOUNTER — Ambulatory Visit (INDEPENDENT_AMBULATORY_CARE_PROVIDER_SITE_OTHER): Payer: BC Managed Care – PPO | Admitting: Family Medicine

## 2013-07-27 VITALS — BP 122/78 | HR 78 | Temp 98.1°F | Ht 63.5 in | Wt 166.1 lb

## 2013-07-27 DIAGNOSIS — IMO0001 Reserved for inherently not codable concepts without codable children: Secondary | ICD-10-CM

## 2013-07-27 DIAGNOSIS — R109 Unspecified abdominal pain: Secondary | ICD-10-CM

## 2013-07-27 DIAGNOSIS — R030 Elevated blood-pressure reading, without diagnosis of hypertension: Secondary | ICD-10-CM

## 2013-07-27 DIAGNOSIS — N3090 Cystitis, unspecified without hematuria: Secondary | ICD-10-CM

## 2013-07-27 DIAGNOSIS — N3010 Interstitial cystitis (chronic) without hematuria: Secondary | ICD-10-CM

## 2013-07-27 NOTE — Patient Instructions (Signed)
Urinary Tract Infection  Urinary tract infections (UTIs) can develop anywhere along your urinary tract. Your urinary tract is your body's drainage system for removing wastes and extra water. Your urinary tract includes two kidneys, two ureters, a bladder, and a urethra. Your kidneys are a pair of bean-shaped organs. Each kidney is about the size of your fist. They are located below your ribs, one on each side of your spine.  CAUSES  Infections are caused by microbes, which are microscopic organisms, including fungi, viruses, and bacteria. These organisms are so small that they can only be seen through a microscope. Bacteria are the microbes that most commonly cause UTIs.  SYMPTOMS   Symptoms of UTIs may vary by age and gender of the patient and by the location of the infection. Symptoms in young women typically include a frequent and intense urge to urinate and a painful, burning feeling in the bladder or urethra during urination. Older women and men are more likely to be tired, shaky, and weak and have muscle aches and abdominal pain. A fever may mean the infection is in your kidneys. Other symptoms of a kidney infection include pain in your back or sides below the ribs, nausea, and vomiting.  DIAGNOSIS  To diagnose a UTI, your caregiver will ask you about your symptoms. Your caregiver also will ask to provide a urine sample. The urine sample will be tested for bacteria and white blood cells. White blood cells are made by your body to help fight infection.  TREATMENT   Typically, UTIs can be treated with medication. Because most UTIs are caused by a bacterial infection, they usually can be treated with the use of antibiotics. The choice of antibiotic and length of treatment depend on your symptoms and the type of bacteria causing your infection.  HOME CARE INSTRUCTIONS   If you were prescribed antibiotics, take them exactly as your caregiver instructs you. Finish the medication even if you feel better after you  have only taken some of the medication.   Drink enough water and fluids to keep your urine clear or pale yellow.   Avoid caffeine, tea, and carbonated beverages. They tend to irritate your bladder.   Empty your bladder often. Avoid holding urine for long periods of time.   Empty your bladder before and after sexual intercourse.   After a bowel movement, women should cleanse from front to back. Use each tissue only once.  SEEK MEDICAL CARE IF:    You have back pain.   You develop a fever.   Your symptoms do not begin to resolve within 3 days.  SEEK IMMEDIATE MEDICAL CARE IF:    You have severe back pain or lower abdominal pain.   You develop chills.   You have nausea or vomiting.   You have continued burning or discomfort with urination.  MAKE SURE YOU:    Understand these instructions.   Will watch your condition.   Will get help right away if you are not doing well or get worse.  Document Released: 04/10/2005 Document Revised: 12/31/2011 Document Reviewed: 08/09/2011  [employer] Patient Information 2014 [employer].

## 2013-07-27 NOTE — Progress Notes (Signed)
Patient ID: Brittney Townsend, female   DOB: April 06, 1971, 43 y.o.   MRN: [id number]  CC: Pt c/o abdominal pain x 6 weeks.  HPI: Pt presents with constant abdominal pain x last 6 weeks. Pain is in lower abdomen and has not gotten worse or better. Pt went to her urologist and was prescribed Vesicare for overactive bladder but patient does not believe this medication was appropriate for her health complaint. Vesicare was taken x 1 week and then discontinued; abdominal pain continued. Pt reports extremely low libido and c/o vaginal dryness with sexual intercourse. She reports feeling like she needs to urinate during intercourse. Pt denies changes in urinary frequency or urgency, dysuria, or hematuria. Pt denies vaginal discharge or pain with intercourse. Pt denies changes in bowel movements.   ROS: Pt denies headaches, nausea, vomiting, fevers, chills, fatigue. Denies abnormal anxiety or  Stress. Denies weight gain or weight loss.   Past Medical History  Diagnosis Date  . Allergy     seasonal  . Overweight 09/28/2010  . Other and unspecified ovarian cyst 09/28/2010  . MIGRAINE HEADACHE 09/28/2010  . 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 HYPERTRIGLYCERIDEMIA 09/28/2010  . GERD 09/28/2010  . CHEST PAIN, ATYPICAL 09/28/2010  . ANXIETY DISORDER 09/28/2010  . Anxiety disorder 11/02/2010  . Knee pain, left 12/12/2010  . Sinusitis acute 12/12/2010  . Environmental allergies 12/12/2010  . Vaginitis and vulvovaginitis 12/24/2010  . Acute epigastric pain 04/15/2011  . Hyperlipidemia 05/01/2011  . Allergic state 10/24/2011  . Elevated BP 10/24/2011  . Otitis media 05/09/2012  . Insomnia 09/26/2012    Family History  Problem Relation Age of Onset  . Hypertension Mother   . Hyperlipidemia Father   . Diabetes Father     type 2  . Cancer Maternal Grandmother     Lung/ previous smoker  . Hypertension Maternal Grandmother   . Hyperlipidemia Maternal Grandmother   . Other Maternal Grandfather     renal failure  .  Heart disease Maternal Grandfather   . Hypertension Maternal Grandfather   . Hyperlipidemia Maternal Grandfather   . Colon polyps Maternal Grandfather   . Diabetes Paternal Grandmother   . Hypertension Paternal Grandmother   . Hyperlipidemia Paternal Grandmother   . Hypertension Paternal Grandfather   . Hyperlipidemia Paternal Grandfather   . Cancer Paternal Grandfather     skin  . Depression Brother   . Obesity Son   . Heart attack Paternal Aunt     smoker  . Heart attack Paternal Uncle     smoker    Allergies  Allergen Reactions  . Sulfonamide Derivatives     REACTION: hives      Current Outpatient Prescriptions on File Prior to Visit  Medication Sig Dispense Refill  . ALPRAZolam (XANAX) 0.25 MG tablet 1/2 to 1 tablet by mouth twice daily as needed.  60 tablet  0  . celecoxib (CELEBREX) 200 MG capsule Take 1 capsule (200 mg total) by mouth daily.  12 capsule  0  . cetirizine (ZYRTEC) 10 MG tablet 1 tab twice daily as needed for congestion  30 tablet  11  . clindamycin-benzoyl peroxide (BENZACLIN) gel Apply topically at bedtime as needed.  25 g  2  . cyclobenzaprine (FLEXERIL) 10 MG tablet Take 1 tablet (10 mg total) by mouth 3 (three) times daily as needed for muscle spasms.  40 tablet  0  . dexlansoprazole (DEXILANT) 60 MG capsule Take 1 capsule (60 mg total) by mouth daily.  30 capsule  9  .  HYDROcodone-acetaminophen (NORCO/VICODIN) 5-325 MG per tablet 1-2 tabs po q6h prn pain  40 tablet  0  . Multiple Vitamin (MULTIVITAMIN) tablet Take 1 tablet by mouth daily.      Marland Kitchen PREMARIN 1.25 MG tablet TAKE 1 TABLET (1.25 MG TOTAL) BY MOUTH DAILY.  30 tablet  0  . Probiotic Product (PRO-FLORA CONCENTRATE) CAPS Take 1 capsule by mouth daily.        . traZODone (DESYREL) 50 MG tablet Take 1 tablet (50 mg total) by mouth at bedtime.  30 tablet  5  . URELLE (URELLE/URISED) 81 MG TABS Take 1 tablet (81 mg total) by mouth 3 (three) times daily as needed.  90 each  5   No current  facility-administered medications on file prior to visit.   Physical Exam  Constitutional: She is oriented to person, place, and time and well-developed, well-nourished, and in no distress. She appears not lethargic, to not be writhing in pain, not malnourished, not dehydrated and not jaundiced. She appears healthy.  Non-toxic appearance.    HENT:  Head: Normocephalic and atraumatic. Not macrocephalic.  Cardiovascular: Normal rate, regular rhythm and normal heart sounds.  Exam reveals no gallop and no friction rub.   No murmur heard. Pulmonary/Chest: Effort normal and breath sounds normal. No stridor.  Abdominal: Soft. She exhibits no distension and no mass. Bowel sounds are not hypoactive. There is tenderness in the suprapubic area. There is no rigidity, no rebound and no guarding.  Neurological: She is oriented to person, place, and time. She appears not lethargic.  Skin: Skin is warm and dry. No rash noted. She is not diaphoretic. No erythema.  Psychiatric: Mood, memory, affect and judgment normal.   General Impression: Pt is not toxic-appearing but look slightly uncomfortable.   Assessment Problems: 1. Constant lower abdominal pain 2. Vaginal dryness 3. Low libido 4. Hypoactive BS 5. Urgency during sexual intercourse   Ddx: 1. Prolapsed bladder 2. Mild constipation 3. Abdominopelvic adhesions s/p hysterectomy.  4. Interstitial Cystitis uncontrolled or worsened on current meds 5. UTI  Plan: Pharm: None at this time.  Non-pharm: Kegel exercises 1x10 BID. Continue Probiotics, yogurt, fluids.  Referral: G[facility] at [facility]. Pt Education: Pt was educated on constipation and kegel exercises.  Labs/Diagnostics: Urinalysis & Culture, Abd/pelvic Xray today. Pelvic US was considered but will wait for GYN referral.   Prior to next f/u: TSH, Lipid panel, chemistry panel, cbc, LFTs F/U:  3-4 months for annual physical exam   Signed 07/27/2013     [place] Durelle Zepeda,  PA-S.  Patient seen, interviewed and examined with student, agree with documentation Vaginitis and vulvovaginitis C/o increasing vaginal dryness, decreased libido and recurrent urinary hesitancy. Will refer to gyn for further work up at this time  INTERSTITIAL CYSTITIS Check urine culture today, encouraged adequate hydration and probiotics daily. Ordered Abdominal xray but patient did not complete  Elevated BP Well controlled, no changes

## 2013-07-27 NOTE — Progress Notes (Signed)
Pre visit review using our clinic review tool, if applicable. No additional management support is needed unless otherwise documented below in the visit note. 

## 2013-08-01 NOTE — Assessment & Plan Note (Signed)
Well controlled, no changes 

## 2013-08-01 NOTE — Assessment & Plan Note (Signed)
C/o increasing vaginal dryness, decreased libido and recurrent urinary hesitancy. Will refer to gyn for further work up at this time

## 2013-08-01 NOTE — Assessment & Plan Note (Addendum)
Check urine culture today, encouraged adequate hydration and probiotics daily. Ordered Abdominal xray but patient did not complete

## 2013-09-01 ENCOUNTER — Other Ambulatory Visit: Payer: Self-pay | Admitting: Urology

## 2013-09-14 ENCOUNTER — Encounter (HOSPITAL_BASED_OUTPATIENT_CLINIC_OR_DEPARTMENT_OTHER): Payer: Self-pay | Admitting: *Deleted

## 2013-09-14 ENCOUNTER — Telehealth: Payer: Self-pay | Admitting: Family Medicine

## 2013-09-14 MED ORDER — FLUCONAZOLE 150 MG PO TABS: 150.0000 mg | ORAL_TABLET | ORAL | Status: DC

## 2013-09-14 NOTE — Telephone Encounter (Signed)
Request to speak to nurse about refills, pt very upset. She states she has called and pharmacy has faxed over request and nothing has been done.

## 2013-09-14 NOTE — Telephone Encounter (Signed)
Ok to call in Diflcuan 150 mg tabs 1 tab po qweekly x 2 weeks disp #2 1 rf

## 2013-09-14 NOTE — Telephone Encounter (Signed)
Please advise refill request? Last office visit was 07-27-13

## 2013-09-14 NOTE — Telephone Encounter (Signed)
Left a message stating that we have not got any refill requests. I stated I could print the encounters and show where nothing has been sent. I advised for pt to make sure the pharmacy has the correct fax # [phone] and/or call back and let us know what she needs refilled.

## 2013-09-14 NOTE — Telephone Encounter (Signed)
Patient states the med she needs is [facility]lucan [facility]

## 2013-09-15 ENCOUNTER — Encounter (HOSPITAL_BASED_OUTPATIENT_CLINIC_OR_DEPARTMENT_OTHER): Payer: Self-pay | Admitting: *Deleted

## 2013-09-15 NOTE — Progress Notes (Signed)
NPO AFTER MN. ARRIVE AT 0830. NEEDS HG. WILL TAKE ZYRTEC AM DOS W/ SIPS OF WATER AND IF NEEDED MAY TAKE HYDROCODONE.

## 2013-09-17 ENCOUNTER — Encounter (HOSPITAL_BASED_OUTPATIENT_CLINIC_OR_DEPARTMENT_OTHER): Payer: BC Managed Care – PPO | Admitting: Anesthesiology

## 2013-09-17 ENCOUNTER — Encounter (HOSPITAL_BASED_OUTPATIENT_CLINIC_OR_DEPARTMENT_OTHER)
Admission: RE | Disposition: A | Discharge status: Home / Self Care | Payer: Self-pay | Source: Ambulatory Visit | Attending: Urology

## 2013-09-17 ENCOUNTER — Ambulatory Visit (HOSPITAL_BASED_OUTPATIENT_CLINIC_OR_DEPARTMENT_OTHER): Payer: BC Managed Care – PPO | Admitting: Anesthesiology

## 2013-09-17 ENCOUNTER — Encounter (HOSPITAL_BASED_OUTPATIENT_CLINIC_OR_DEPARTMENT_OTHER): Payer: Self-pay | Admitting: *Deleted

## 2013-09-17 ENCOUNTER — Ambulatory Visit (HOSPITAL_BASED_OUTPATIENT_CLINIC_OR_DEPARTMENT_OTHER)
Admission: RE | Admit: 2013-09-17 | Discharge: 2013-09-17 | Disposition: A | Discharge status: Home / Self Care | Payer: BC Managed Care – PPO | Source: Ambulatory Visit | Attending: Urology | Admitting: Urology

## 2013-09-17 DIAGNOSIS — N3010 Interstitial cystitis (chronic) without hematuria: Secondary | ICD-10-CM | POA: Insufficient documentation

## 2013-09-17 DIAGNOSIS — Z87440 Personal history of urinary (tract) infections: Secondary | ICD-10-CM | POA: Insufficient documentation

## 2013-09-17 DIAGNOSIS — N8111 Cystocele, midline: Secondary | ICD-10-CM | POA: Insufficient documentation

## 2013-09-17 DIAGNOSIS — M62838 Other muscle spasm: Secondary | ICD-10-CM | POA: Insufficient documentation

## 2013-09-17 DIAGNOSIS — IMO0002 Reserved for concepts with insufficient information to code with codable children: Secondary | ICD-10-CM | POA: Insufficient documentation

## 2013-09-17 DIAGNOSIS — Z79899 Other long term (current) drug therapy: Secondary | ICD-10-CM | POA: Insufficient documentation

## 2013-09-17 DIAGNOSIS — Z9089 Acquired absence of other organs: Secondary | ICD-10-CM | POA: Insufficient documentation

## 2013-09-17 DIAGNOSIS — Z90710 Acquired absence of both cervix and uterus: Secondary | ICD-10-CM | POA: Insufficient documentation

## 2013-09-17 DIAGNOSIS — F411 Generalized anxiety disorder: Secondary | ICD-10-CM | POA: Insufficient documentation

## 2013-09-17 DIAGNOSIS — K219 Gastro-esophageal reflux disease without esophagitis: Secondary | ICD-10-CM | POA: Insufficient documentation

## 2013-09-17 DIAGNOSIS — N393 Stress incontinence (female) (male): Secondary | ICD-10-CM | POA: Insufficient documentation

## 2013-09-17 HISTORY — DX: Other symptoms and signs involving the genitourinary system: R39.89

## 2013-09-17 HISTORY — DX: Personal history of other diseases of the female genital tract: Z87.42

## 2013-09-17 HISTORY — DX: Elevated blood-pressure reading, without diagnosis of hypertension: R03.0

## 2013-09-17 HISTORY — PX: CYSTO WITH HYDRODISTENSION: SHX5453

## 2013-09-17 HISTORY — DX: Unspecified symptoms and signs involving the genitourinary system: R39.9

## 2013-09-17 HISTORY — DX: Other specified postprocedural states: Z98.890

## 2013-09-17 HISTORY — DX: Pure hyperglyceridemia: E78.1

## 2013-09-17 HISTORY — DX: Spondylolisthesis, lumbar region: M43.16

## 2013-09-17 HISTORY — DX: Other seasonal allergic rhinitis: J30.2

## 2013-09-17 HISTORY — DX: Gastro-esophageal reflux disease without esophagitis: K21.9

## 2013-09-17 HISTORY — DX: Vitamin D deficiency, unspecified: E55.9

## 2013-09-17 HISTORY — DX: Other specified postprocedural states: R11.2

## 2013-09-17 HISTORY — DX: Interstitial cystitis (chronic) without hematuria: N30.10

## 2013-09-17 HISTORY — DX: Personal history of other diseases of the digestive system: Z87.19

## 2013-09-17 LAB — POCT HEMOGLOBIN-HEMACUE: HEMOGLOBIN: 14 g/dL (ref 12.0–15.0)

## 2013-09-17 SURGERY — CYSTOSCOPY, WITH BLADDER HYDRODISTENSION
Anesthesia: General | Site: Bladder | Laterality: Right

## 2013-09-17 MED ORDER — PROPOFOL 10 MG/ML IV BOLUS
INTRAVENOUS | Status: DC | PRN
  Administered 2013-09-17: 200 mg via INTRAVENOUS

## 2013-09-17 MED ORDER — MIDAZOLAM HCL 5 MG/5ML IJ SOLN
INTRAMUSCULAR | Status: DC | PRN
  Administered 2013-09-17: 2 mg via INTRAVENOUS

## 2013-09-17 MED ORDER — OXYCODONE HCL 5 MG PO TABS
5.0000 mg | ORAL_TABLET | Freq: Once | ORAL | Status: DC | PRN
  Filled 2013-09-17: qty 1

## 2013-09-17 MED ORDER — STERILE WATER FOR IRRIGATION IR SOLN
Status: DC | PRN
  Administered 2013-09-17: 3000 mL

## 2013-09-17 MED ORDER — BELLADONNA ALKALOIDS-OPIUM 16.2-60 MG RE SUPP
RECTAL | Status: DC | PRN
  Administered 2013-09-17: 1 via RECTAL

## 2013-09-17 MED ORDER — ONDANSETRON HCL 4 MG/2ML IJ SOLN
INTRAMUSCULAR | Status: DC | PRN
  Administered 2013-09-17: 4 mg via INTRAVENOUS

## 2013-09-17 MED ORDER — LACTATED RINGERS IV SOLN
INTRAVENOUS | Status: DC
  Administered 2013-09-17 (×2): via INTRAVENOUS
  Filled 2013-09-17: qty 1000

## 2013-09-17 MED ORDER — MIDAZOLAM HCL 2 MG/2ML IJ SOLN
INTRAMUSCULAR | Status: AC
  Filled 2013-09-17: qty 2

## 2013-09-17 MED ORDER — FENTANYL CITRATE 0.05 MG/ML IJ SOLN
INTRAMUSCULAR | Status: AC
  Filled 2013-09-17: qty 4

## 2013-09-17 MED ORDER — BELLADONNA ALKALOIDS-OPIUM 16.2-60 MG RE SUPP
RECTAL | Status: AC
  Filled 2013-09-17: qty 1

## 2013-09-17 MED ORDER — FENTANYL CITRATE 0.05 MG/ML IJ SOLN
INTRAMUSCULAR | Status: DC | PRN
  Administered 2013-09-17: 50 ug via INTRAVENOUS

## 2013-09-17 MED ORDER — MEPERIDINE HCL 25 MG/ML IJ SOLN
6.2500 mg | INTRAMUSCULAR | Status: DC | PRN
  Filled 2013-09-17: qty 1

## 2013-09-17 MED ORDER — DEXAMETHASONE SODIUM PHOSPHATE 4 MG/ML IJ SOLN
INTRAMUSCULAR | Status: DC | PRN
  Administered 2013-09-17: 10 mg via INTRAVENOUS

## 2013-09-17 MED ORDER — OXYCODONE-ACETAMINOPHEN 5-325 MG PO TABS: 1.0000 | ORAL_TABLET | ORAL | Status: DC | PRN

## 2013-09-17 MED ORDER — BUPIVACAINE HCL 0.5 % IJ SOLN
INTRAMUSCULAR | Status: DC | PRN
  Administered 2013-09-17: 30 mL

## 2013-09-17 MED ORDER — OXYCODONE HCL 5 MG/5ML PO SOLN
5.0000 mg | Freq: Once | ORAL | Status: DC | PRN
  Filled 2013-09-17: qty 5

## 2013-09-17 MED ORDER — PHENAZOPYRIDINE HCL 200 MG PO TABS
ORAL_TABLET | ORAL | Status: DC | PRN
  Administered 2013-09-17: 11:00:00 via INTRAVESICAL

## 2013-09-17 MED ORDER — PROMETHAZINE HCL 25 MG/ML IJ SOLN
6.2500 mg | INTRAMUSCULAR | Status: DC | PRN
  Filled 2013-09-17: qty 1

## 2013-09-17 MED ORDER — TRIAMCINOLONE ACETONIDE 40 MG/ML IJ SUSP
INTRAMUSCULAR | Status: DC | PRN
  Administered 2013-09-17: 80 mg via INTRAMUSCULAR

## 2013-09-17 MED ORDER — LIDOCAINE HCL (CARDIAC) 20 MG/ML IV SOLN
INTRAVENOUS | Status: DC | PRN
  Administered 2013-09-17: 80 mg via INTRAVENOUS

## 2013-09-17 MED ORDER — ACETAMINOPHEN 10 MG/ML IV SOLN
1000.0000 mg | Freq: Once | INTRAVENOUS | Status: AC
  Administered 2013-09-17: 1000 mg via INTRAVENOUS
  Filled 2013-09-17: qty 100

## 2013-09-17 MED ORDER — HYDROMORPHONE HCL PF 1 MG/ML IJ SOLN
0.2500 mg | INTRAMUSCULAR | Status: DC | PRN
  Filled 2013-09-17: qty 1

## 2013-09-17 MED ORDER — CEFAZOLIN SODIUM-DEXTROSE 2-3 GM-% IV SOLR
2.0000 g | INTRAVENOUS | Status: AC
  Administered 2013-09-17: 2 g via INTRAVENOUS
  Filled 2013-09-17: qty 50

## 2013-09-17 SURGICAL SUPPLY — 23 items
ADAPTER CATH WHT DISP STRL (CATHETERS) IMPLANT
BAG DRAIN URO-CYSTO SKYTR STRL (DRAIN) ×3 IMPLANT
BOOTIES KNEE HIGH SLOAN (MISCELLANEOUS) ×3 IMPLANT
CANISTER SUCT LVC 12 LTR MEDI- (MISCELLANEOUS) ×3 IMPLANT
CATH ROBINSON RED A/P 16FR (CATHETERS) ×3 IMPLANT
CLOTH BEACON ORANGE TIMEOUT ST (SAFETY) ×3 IMPLANT
DRAPE CAMERA CLOSED 9X96 (DRAPES) ×3 IMPLANT
ELECT REM PT RETURN 9FT ADLT (ELECTROSURGICAL) ×3
ELECTRODE REM PT RTRN 9FT ADLT (ELECTROSURGICAL) ×1 IMPLANT
GLOVE BIO SURGEON STRL SZ7.5 (GLOVE) ×3 IMPLANT
GLOVE ECLIPSE 8.0 STRL XLNG CF (GLOVE) ×3 IMPLANT
GOWN PREVENTION PLUS LG XLONG (DISPOSABLE) IMPLANT
GOWN STRL REIN XL XLG (GOWN DISPOSABLE) IMPLANT
GOWN STRL REUS W/TWL XL LVL3 (GOWN DISPOSABLE) ×6 IMPLANT
NDL SAFETY ECLIPSE 18X1.5 (NEEDLE) ×1 IMPLANT
NEEDLE HYPO 18GX1.5 SHARP (NEEDLE) ×2
NEEDLE HYPO 22GX1.5 SAFETY (NEEDLE) ×3 IMPLANT
NEEDLE SPNL 22GX7 QUINCKE BK (NEEDLE) ×3 IMPLANT
NS IRRIG 500ML POUR BTL (IV SOLUTION) IMPLANT
PACK CYSTOSCOPY (CUSTOM PROCEDURE TRAY) ×3 IMPLANT
SYR 20CC LL (SYRINGE) ×9 IMPLANT
SYR BULB IRRIGATION 50ML (SYRINGE) IMPLANT
WATER STERILE IRR 3000ML UROMA (IV SOLUTION) ×3 IMPLANT

## 2013-09-17 NOTE — Op Note (Signed)
Pre-operative diagnosis :   Interstitial cystitis with right levator ani spasm  Postoperative diagnosis:  Same  Operation:  Cystourethroscopy, hydrodistention the bladder (750 cc), insertion of pretty Marcaine the bladder, injection of Marcaine Kenalog in the subtrigonal space, injection of Marcaine Kenalog in the right vaginal levator muscle group.  Surgeon:  Kathie RhodesS. Patsi Searsannenbaum, MD  First assistant:  None  Anesthesia:  General LMA  Preparation:  After appropriate preanesthesia, the patient was brought to the operating room, placed on the upper table in the dorsal supine position where general LMA anesthesia was introduced. Right arm was marked. History was reviewed.  Review history:  Problems  1. Chronic interstitial cystitis without hematuria (595.1)   Assessed By: Jethro Bolusannenbaum, Kierrah Kilbride (Urology); Last Assessed: 25 Aug 2013  2. Cystocele, midline (618.01)  3. Female stress incontinence (625.6)   Assessed By: Jethro Bolusannenbaum, Tyniya Kuyper (Urology); Last Assessed: 25 Aug 2013  History of Present Illness  43 yo female returns today for further evaluation of a cystocele & to discuss surgery. She was previously seen by Seward GraterNadine Nguyen, NP on 08/10/13 per her gynecologist's request [facility]( [place]) for reassessment of cystocele since cystocele maybe a contributing factor to pain w/ intercourse. When she saw her GYN, she had a UTI w/ positive culture and given amoxicillin.  Hx of IC. She has been having IC flare ups for the past 2 months. IC symptoms: constant suprapubic pain and worse w/ intercourse. Denies any change in diet, drinks, stress, or medications. She had been well controlled w/ Elmiron 100mg  daily and Urelle. She was given Vesicare due to frequency when she was seen in October 2014 but she had stopped taking Vesicare due to extreme dry mouth and pain in her kidney area. She had HOD in the 90s and is thinking of having another HOD done since this SP pain from IC is starting to affect her ADLs. She is  interested in have her bladder "tacked" during the HOD procedure since this bladder "tack" was supposed to have been done during her hysterectomy 7 years ago but was canceled due to some complications during the hysterectomy. Denies any other associated voiding signs/symptoms or alleviating/aggravating factors.  Corrie DandyMary is P 2-2-0, now 8 yrs post hysterectomy. She noticed vaginal pressure and a "lump" " in the way". This appeared in October, 2014 with complaints of urinary urgency during intercourse, not responsive to Vesicare 2ndary vaginal dryness. She has severe dyspareunia. She leaks with cough, laugh, or sneeze or working out. She uses 2ppd. She has nocturia x 3-4.    Statement of  Likelihood of Success: Excellent. TIME-OUT observed.:  Procedure:  Examination of the vagina revealed normal BUN is. The ureter was in normal position. I did not detect cystocele enterocele rectocele. Cystourethroscopy revealed normal-appearing urethra. The bladder base was normal. The trigone was normal. Clear reflux was seen from both cortices. Hydrodistention was accomplished to 750 cc of normal saline. Few areas of glomerulations were identified. These were photo documented. After hydrodistention, the bladder was drained of fluid. Pretty him Marcaine solution was inserted in the bladder, and Marcaine Kenalog was injected in the subtrigonal space. Additional Marcaine Kenalog was then injected into the right levator muscle group.   The patient was given IV Tylenol. She was not given IV Toradol because of the national backorder on medication. She was given a B. and O. suppository. She was awakened, and taken to recovery room in good condition.

## 2013-09-17 NOTE — Transfer of Care (Signed)
Immediate Anesthesia Transfer of Care Note  Patient: Brittney Townsend  Procedure(s) Performed: Procedure(s) (LRB): CYSTOSCOPY/HYDRODISTENSION WITH INJECTION OF MARCAINE AND PYRIDIUM INTO BLADDER AND INJECT MARCAINE AND KENOLOG IN BLADDER BASE, INJECT MARCAIN,KENOLOG INTO RIGHT LEVATOR MUSCLE (Right)  Patient Location: PACU  Anesthesia Type: General  Level of Consciousness: awake, oriented, sedated and patient cooperative  Airway & Oxygen Therapy: Patient Spontanous Breathing and Patient connected to face mask oxygen  Post-op Assessment: Report given to PACU RN and Post -op Vital signs reviewed and stable  Post vital signs: Reviewed and stable  Complications: No apparent anesthesia complications

## 2013-09-17 NOTE — Anesthesia Procedure Notes (Signed)
Procedure Name: LMA Insertion Date/Time: 09/17/2013 10:38 AM Performed by: Maris BergerENENNY, Brittney Townsend Pre-anesthesia Checklist: Patient identified, Emergency Drugs available, Suction available and Patient being monitored Patient Re-evaluated:Patient Re-evaluated prior to inductionOxygen Delivery Method: Circle System Utilized Preoxygenation: Pre-oxygenation with 100% oxygen Intubation Type: IV induction Ventilation: Mask ventilation without difficulty LMA: LMA inserted LMA Size: 4.0 Number of attempts: 1 Airway Equipment and Method: bite block Placement Confirmation: positive ETCO2 Dental Injury: Teeth and Oropharynx as per pre-operative assessment

## 2013-09-17 NOTE — Anesthesia Preprocedure Evaluation (Signed)
Anesthesia Evaluation  Patient identified by MRN, date of birth, ID band Patient awake    Reviewed: Allergy & Precautions, H&P , NPO status , Patient's Chart, lab work & pertinent test results  History of Anesthesia Complications (+) PONV and history of anesthetic complications  Airway Mallampati: I TM Distance: >3 FB Neck ROM: Full    Dental  (+) Dental Advisory Given   Pulmonary neg pulmonary ROS,          Cardiovascular negative cardio ROS      Neuro/Psych  Headaches, PSYCHIATRIC DISORDERS Anxiety    GI/Hepatic Neg liver ROS, hiatal hernia, GERD-  Medicated,  Endo/Other  negative endocrine ROS  Renal/GU negative Renal ROS     Musculoskeletal negative musculoskeletal ROS (+)   Abdominal   Peds  Hematology negative hematology ROS (+)   Anesthesia Other Findings   Reproductive/Obstetrics negative OB ROS                           Anesthesia Physical Anesthesia Plan  ASA: II  Anesthesia Plan: General   Post-op Pain Management:    Induction: Intravenous  Airway Management Planned: LMA  Additional Equipment:   Intra-op Plan:   Post-operative Plan: Extubation in OR  Informed Consent: I have reviewed the patients History and Physical, chart, labs and discussed the procedure including the risks, benefits and alternatives for the proposed anesthesia with the patient or authorized representative who has indicated his/her understanding and acceptance.   Dental advisory given  Plan Discussed with: CRNA  Anesthesia Plan Comments:         Anesthesia Quick Evaluation

## 2013-09-17 NOTE — Discharge Instructions (Addendum)
Interstitial Cystitis Interstitial cystitis (IC) is a condition that results in discomfort or pain in the bladder and the surrounding pelvic region. The symptoms can be different from case to case and even in the same individual. People may experience:  Mild discomfort.  Pressure.  Tenderness.  Intense pain in the bladder and pelvic area. CAUSES  Because IC varies so much in symptoms and severity, people studying this disease believe it is not one but several diseases. Some caregivers use the term painful bladder syndrome (PBS) to describe cases with painful urinary symptoms. This may not meet the strictest definition of IC. The term IC / PBS includes all cases of urinary pain that cannot be connected to other causes, such as infection or urinary stones.  SYMPTOMS  Symptoms may include:  An urgent need to urinate.  A frequent need to urinate.  A combination of these symptoms. Pain may change in intensity as the bladder fills with urine or as it empties. Women's symptoms often get worse during menstruation. They may sometimes experience pain with vaginal intercourse. Some of the symptoms of IC / PBS seem like those of bacterial infection. Tests do not show infection. IC / PBS is far more common in women than in men.  DIAGNOSIS  The diagnosis of IC / PBS is based on:  Presence of pain related to the bladder, usually along with problems of frequency and urgency.  Not finding other diseases that could cause the symptoms.  Diagnostic tests that help rule out other diseases include:  Urinalysis.  Urine culture.  Cystoscopy.  Biopsy of the bladder wall.  Distension of the bladder under anesthesia.  Urine cytology.  Laboratory examination of prostate secretions. A biopsy is a tissue sample that can be looked at under a microscope. Samples of the bladder and urethra may be removed during a cystoscopy. A biopsy helps rule out bladder cancer. TREATMENT  Scientists have not yet found  a cure for IC / PBS. Patients with IC / PBS do not get better with antibiotic therapy. Caregivers cannot predict who will respond best to which treatment. Symptoms may disappear without explanation. Disappearing symptoms may coincide with an event such as a change in diet or treatment. Even when symptoms disappear, they may return after days, weeks, months, or years.  Because the causes of IC / PBS are unknown, current treatments are aimed at relieving symptoms. Many people are helped by one or a combination of the treatments. As researchers learn more about IC / PBS, the list of potential treatments will change. Patients should discuss their options with a caregiver. SURGERY  Surgery should be considered only if all available treatments have failed and the pain is disabling. Many approaches and techniques are used. Each approach has its own advantages and complications. Advantages and complications should be discussed with a urologist. Your caregiver may recommend consulting another urologist for a second opinion. Most caregivers are reluctant to operate because the outcome is unpredictable. Some people still have symptoms after surgery.  People considering surgery should discuss the potential risks and benefits, side effects, and long- and short-term complications with their family, as well as with people who have already had the procedure. Surgery requires anesthesia, hospitalization, and in some cases weeks or months of recovery. As the complexity of the procedure increases, so do the chances for complications and for failure. HOME CARE INSTRUCTIONS   All drugs, even those sold over the counter, have side effects. Patients should always consult a caregiver before using any  drug for an extended amount of time. Only take over-the-counter or prescription medicines for pain, discomfort, or fever as directed by your caregiver.  Many patients feel that smoking makes their symptoms worse. How the by-products  of tobacco that are excreted in the urine affect IC / PBS is unknown. Smoking is the major known cause of bladder cancer. One of the best things smokers can do for their bladder and their overall health is to quit.  Many patients feel that gentle stretching exercises help relieve IC / PBS symptoms.  Methods vary, but basically patients decide to empty their bladder at designated times and use relaxation techniques and distractions to keep to the schedule. Gradually, patients try to lengthen the time between scheduled voids. A diary in which to record voiding times is usually helpful in keeping track of progress. MAKE SURE YOU:   Understand these instructions.  Will watch your condition.  Will get help right away if you are not doing well or get worse. Document Released: 03/01/2004 Document Revised: 09/23/2011 Document Reviewed: 05/16/2008 [facility] Patient Information 2014 [employer], [place].   Post Anesthesia Home Care Instructions  Activity: Get plenty of rest for the remainder of the day. A responsible adult should stay with you for 24 hours following the procedure.  For the next 24 hours, DO NOT: -Drive a car -Advertising copywriterperate machinery -Drink alcoholic beverages -Take any medication unless instructed by your physician -Make any legal decisions or sign important papers.  Meals: Start with liquid foods such as gelatin or soup. Progress to regular foods as tolerated. Avoid greasy, spicy, heavy foods. If nausea and/or vomiting occur, drink only clear liquids until the nausea and/or vomiting subsides. Call your physician if vomiting continues.  Special Instructions/Symptoms: Your throat may feel dry or sore from the anesthesia or the breathing tube placed in your throat during surgery. If this causes discomfort, gargle with warm salt water. The discomfort should disappear within 24 hours.  CYSTOSCOPY HOME CARE INSTRUCTIONS  Activity: Rest for the remainder of the day.  Do not drive or operate  equipment today.  You may resume normal activities in one to two days as instructed by your physician.   Meals: Drink plenty of liquids and eat light foods such as gelatin or soup this evening.  You may return to a normal meal plan tomorrow.  Return to Work: You may return to work in one to two days or as instructed by your physician.  Special Instructions / Symptoms: Call your physician if any of these symptoms occur:   -persistent or heavy bleeding  -bleeding which continues after first few urination  -large blood clots that are difficult to pass  -urine stream diminishes or stops completely  -fever equal to or higher than 101 degrees Farenheit.  -cloudy urine with a strong, foul odor  -severe pain  Females should always wipe from front to back after elimination.  You may feel some burning pain when you urinate.  This should disappear with time.  Applying moist heat to the lower abdomen or a hot tub bath may help relieve the pain. \  Follow-Up / Date of Return Visit to Your Physician:  *** Call for an appointment to arrange follow-up.  Patient Signature:  ________________________________________________________  Nurse's Signature:  ________________________________________________________

## 2013-09-17 NOTE — Interval H&P Note (Signed)
History and Physical Interval Note:  09/17/2013 10:08 AM  Brittney Townsend  has presented today for surgery, with the diagnosis of INTERSTITIAL CYSTITIS  The various methods of treatment have been discussed with the patient and family. After consideration of risks, benefits and other options for treatment, the patient has consented to  Procedure(s): CYSTOSCOPY/HYDRODISTENSION WITH INJECTION OF MARCAINE AND PYRIDIUM INTO BLADDER AND INJECT MARCAINE AND KENOLOG IN BLADDER BASE, INJECT MARCAIN,KENOLOG INTO RIGHT LEVATOR MUSCLE (N/A) as a surgical intervention .  The patient's history has been reviewed, patient examined, no change in status, stable for surgery.  I have reviewed the patient's chart and labs.  Questions were answered to the patient's satisfaction.     Jethro BolusANNENBAUM, Leondra Cullin I

## 2013-09-17 NOTE — H&P (Signed)
Reason For Visit Evaluate cystocele & discuss surgery   Active Problems Problems  1. Chronic interstitial cystitis without hematuria (595.1)   Assessed By: Jethro Bolus (Urology); Last Assessed: 25 Aug 2013 2. Cystocele, midline (618.01) 3. Female stress incontinence (625.6)   Assessed By: Jethro Bolus (Urology); Last Assessed: 25 Aug 2013  History of Present Illness 43 yo female returns today for further evaluation of a cystocele & to discuss surgery. She was previously seen by Seward Grater, NP on 08/10/13 per her gynecologist's request [facility]) for reassessment of cystocele since cystocele maybe a contributing factor to pain w/ intercourse.  When she saw her GYN, she had a UTI w/ positive culture and given amoxicillin.      Hx of IC. She has been having IC flare ups for the past 2 months. IC symptoms: constant suprapubic pain and worse w/ intercourse. Denies any change in diet, drinks, stress, or medications. She had been well controlled w/ Elmiron 100mg  daily and Urelle. She was given Vesicare due to frequency when she was seen in October 2014 but she had stopped taking Vesicare due to extreme dry mouth and pain in her kidney area. She had HOD in the 90s and is thinking of having another HOD done since this SP pain from IC is starting to affect her ADLs. She is interested in have her bladder "tacked" during the HOD procedure since this bladder "tack" was supposed to have been done during her hysterectomy 7 years ago but was canceled due to some complications during the hysterectomy. Denies any other associated voiding signs/symptoms or alleviating/aggravating factors.   Brittney Townsend is P 2-2-0, now 8 yrs post hysterectomy. She noticed vaginal pressure and a "lump" " in the way". This appeared in October, 2014 with complaints of urinary urgency during intercourse, not responsive to Vesicare 2ndary vaginal dryness. She has severe dyspareunia. She leaks with cough, laugh, or sneeze or  working out. She uses 2ppd. She has nocturia x 3-4.   Past Medical History Problems  1. History of Anxiety (300.00) 2. History of Arthritis (V13.4) 3. History of Closed fracture of lumbar vertebra (805.4) 4. History of depression (V11.8) 5. History of esophageal reflux (V12.79)  Surgical History Problems  1. History of Back Surgery 2. History of Cesarean Section 3. History of Cholecystectomy 4. History of Hysterectomy  Current Meds 1. ALPRAZolam 0.25 MG Oral Tablet; TAKE 1 TABLET Daily PRN;  Therapy: 15Jun2012 to (Evaluate:24Jul2012) Recorded 2. Amoxicillin TABS;  Therapy: (Recorded:27Jan2015) to Recorded 3. Dexilant 60 MG Oral Capsule Delayed Release;  Therapy: 06Jul2011 to Recorded 4. Elmiron 100 MG Oral Capsule; TAKE ONE CAPSULE BY MOUTH 3 TIMES A DAY;  Therapy: 17Jan2008 to (Evaluate:22Dec2013)  Requested for: 27Dec2012; Last  Rx:27Dec2012 Ordered 5. Premarin 0.625 MG/GM Vaginal Cream; apply one drop of premarin cream to vagina  opening every night;  Therapy: 27Jan2015 to (Last Rx:27Jan2015)  Requested for: 27Jan2015 Ordered 6. Premarin 1.25 MG Oral Tablet; TAKE 1 TABLET Daily;  Therapy: (Recorded:27Dec2012) to Recorded 7. Pyridium 200 MG Oral Tablet; TAKE 1 TABLET EVERY 6 TO 8 HOURS AS NEEDED;  Therapy: 27Jan2015 to (Evaluate:17Feb2015)  Requested for: 27Jan2015; Last  Rx:27Jan2015 Ordered 8. TraZODone HCl TABS;  Therapy: (Recorded:27Jan2015) to Recorded 9. Urelle 81 MG Oral Tablet; Take 1 tablet by mouth 3 times a day;  Therapy: 01Oct2013 to (Evaluate:30Nov2013)  Requested for: 01Oct2013; Last  Rx:01Oct2013 Ordered 10. Utira-C 81.6 MG Oral Tablet; Take 1 tablet by mouth 3 times a day;   Therapy: 17Jan2008 to (Evaluate:27Mar2013)  Requested for:  27Dec2012; Last   Rx:27Dec2012 Ordered  Allergies Medication  1. Sulfa Drugs  Family History Problems  1. Family history of Family Health Status - Father's Age   89 2. Family history of Family Health Status - Mother's  Age   8 3. Family history of Family Health Status Number Of Children   2 sons 4. Family history of Nephrolithiasis : Father  Social History Problems  1. Denied: History of Alcohol Use 2. Caffeine Use   2 per day 3. Marital History - Currently Married 4. Never A Smoker 5. Occupation:   data loading 6. Denied: History of Tobacco Use  Review of Systems Genitourinary system(s) were reviewed and pertinent findings if present are noted.  Genitourinary: suprapubic pain, but no urinary frequency, no urinary urgency, no dysuria, no incomplete emptying of bladder and no hematuria.  Gastrointestinal: no nausea, no vomiting, no flank pain and no abdominal pain. pain during intercourse    Vitals Vital Signs [Data Includes: Last 1 Day]  Recorded: 11Feb2015 10:51AM  Height: 5 ft 3 in Weight: 165 lb  BMI Calculated: 29.23 BSA Calculated: 1.78 Blood Pressure: 127 / 93 Temperature: 98 F Heart Rate: 92  Physical Exam Constitutional: Well nourished and well developed . No acute distress.  ENT:. The ears and nose are normal in appearance.  Neck: The appearance of the neck is normal and no neck mass is present.  Pulmonary: No respiratory distress and normal respiratory rhythm and effort.  Cardiovascular: Heart rate and rhythm are normal . No peripheral edema.  Abdomen: The abdomen is soft and nontender. No masses are palpated. No CVA tenderness. No hernias are palpable. No hepatosplenomegaly noted.  Genitourinary:  Chaperone Present: Exie Parody.  Examination of the external genitalia shows normal female external genitalia, no vulvar atrophy and no labial adhesions. The urethra is normal in appearance, not tender and no urethral caruncle. There is no urethral mass. Urethral hypermobility is present. There is no urethral discharge. No periurethral cyst is identified. There is no urethral prolapse. Vaginal exam demonstrates tenderness, but no abnormalities, no atrophy, no discharge, the vaginal  epithelium to be well estrogenized and no mass. A cystocele is present with a midline defect (grade 1 /4). No enterocele is identified. No rectocele is identified. The cervix is is absent. The uterus is absent. The adnexa are palpably normal. The bladder is tender, but normal on palpation, not distended and without masses. The anus is normal on inspection. The perineum is normal on inspection.  Lymphatics: The femoral and inguinal nodes are not enlarged or tender.  Neuro/Psych:. Mood and affect are appropriate.    Results/Data Urine [Data Includes: Last 1 Day]   11Feb2015  COLOR ORANGE   APPEARANCE CLEAR   SPECIFIC GRAVITY 1.020   pH 6.0   GLUCOSE NEG mg/dL  BILIRUBIN NEG   KETONE NEG mg/dL  BLOOD NEG   PROTEIN NEG mg/dL  UROBILINOGEN 0.2 mg/dL  NITRITE POS   LEUKOCYTE ESTERASE NEG   SQUAMOUS EPITHELIAL/HPF FEW   WBC 0-2 WBC/hpf  RBC NONE SEEN RBC/hpf  BACTERIA RARE   CRYSTALS NONE SEEN   CASTS NONE SEEN    Procedure Gaynell Face test: PVR - 50cc and instilled 200cc of sterile water.     Assessment Assessed  1. Chronic interstitial cystitis without hematuria (595.1) 2. Cystocele, midline (618.01) 3. Female stress incontinence (625.6)  Brittney Townsend is a 43 yo female with Interstitial cystitis, and now with severe dyspareunia, and stress urinary incontinence, 7 years post hysterectomy, and P 2-2-0. She has  a + Marshall test in the standing position, needing a classic curtsey maneuver to stop her leakage, and urethral hypermobility and + Q-tip test.   On examination, she has exquisite tenderness in the Right Levator ani muscle, but normal L side of the vagina. She also has pain in the bladder base c/w IC.    She will be Rx with Valium vaginal suppositories, and recommended for cysto/HOD and levator injection, and f/u with PT for consideration of intravaginal trigger point therapy. She does not need vaginal reconstruction at this time. she does have stress incontinence, however, and this will  be addressed by PT also. She may eventually need sling procedure.   Plan Atrophic vaginitis  1. Renew: Premarin 0.625 MG/GM Vaginal Cream; apply one drop of premarin cream to  vagina opening every night Chronic interstitial cystitis without hematuria  2. Start: Diazepam 10 MG Oral Tablet; Vaginal 10 mgm suppositories bid 3. Renew: Elmiron 100 MG Oral Capsule; TAKE ONE CAPSULE BY MOUTH 3 TIMES A DAY Chronic interstitial cystitis without hematuria, Female stress incontinence  4. [employer]arshall Test; Status:Complete;   Done: 11Feb2015  1. Valium vaginal suppositories ( [facility]). Renew elmiron ( [facility])  2. cysto, HOD, inject bladder base; inject Right levator with marcaine and kenalog.   3. consider post-op PT consult.   4. Continue premarin vaginal cream.   Signatures Electronically signed by : Jethro BolusSigmund Lakeith Careaga, M.D.; Aug 25 2013 12:26PM EST

## 2013-09-17 NOTE — Anesthesia Postprocedure Evaluation (Signed)
Anesthesia Post Note  Patient: Brittney Townsend  Procedure(s) Performed: Procedure(s) (LRB): CYSTOSCOPY/HYDRODISTENSION WITH INJECTION OF MARCAINE AND PYRIDIUM INTO BLADDER AND INJECT MARCAINE AND KENOLOG IN BLADDER BASE, INJECT MARCAIN,KENOLOG INTO RIGHT LEVATOR MUSCLE (Right)  Anesthesia type: General  Patient location: PACU  Post pain: Pain level controlled  Post assessment: Post-op Vital signs reviewed  Last Vitals: BP 113/80  Pulse 102  Temp(Src) 36.4 C (Oral)  Resp 16  Ht 5\' 3"  (1.6 m)  Wt 166 lb 8 oz (75.524 kg)  BMI 29.50 kg/m2  SpO2 98%  Post vital signs: Reviewed  Level of consciousness: sedated  Complications: No apparent anesthesia complications

## 2013-09-20 ENCOUNTER — Encounter (HOSPITAL_BASED_OUTPATIENT_CLINIC_OR_DEPARTMENT_OTHER): Payer: Self-pay | Admitting: Urology

## 2013-11-02 ENCOUNTER — Telehealth: Payer: Self-pay | Admitting: Family Medicine

## 2013-11-02 ENCOUNTER — Other Ambulatory Visit: Payer: Self-pay | Admitting: Family Medicine

## 2013-11-02 DIAGNOSIS — G4700 Insomnia, unspecified: Secondary | ICD-10-CM

## 2013-11-02 MED ORDER — TRAZODONE HCL 50 MG PO TABS: 50.0000 mg | ORAL_TABLET | Freq: Every day | ORAL | Status: DC

## 2013-11-02 NOTE — Telephone Encounter (Signed)
Refill trazodone 

## 2013-11-03 NOTE — Telephone Encounter (Signed)
Rx printed and forwarded to Provider for signature.  Medication name:  Name from pharmacy:  ALPRAZolam (XANAX) 0.25 MG tablet  ALPRAZOLAM 0.25 MG TABLET Sig: TAKE 1/2 TO 1 TABLET BY MOUTH TWICE A DAY AS NEEDED Dispense: 60 tablet Refills: 0 Start: 11/02/2013 Class: Normal Requested on: 06/19/2013 Originally ordered on: 10/01/2010 Last refill: 06/19/2013

## 2013-11-05 ENCOUNTER — Other Ambulatory Visit: Payer: Self-pay | Admitting: *Deleted

## 2013-11-05 DIAGNOSIS — N3090 Cystitis, unspecified without hematuria: Secondary | ICD-10-CM

## 2013-11-05 MED ORDER — URELLE 81 MG PO TABS: 1.0000 | ORAL_TABLET | Freq: Three times a day (TID) | ORAL | Status: AC | PRN

## 2013-11-05 NOTE — Telephone Encounter (Signed)
Rx request to pharmacy/SLS  

## 2013-11-08 ENCOUNTER — Other Ambulatory Visit: Payer: Self-pay | Admitting: Gastroenterology

## 2013-11-10 ENCOUNTER — Other Ambulatory Visit: Payer: Self-pay | Admitting: Gastroenterology

## 2013-11-11 ENCOUNTER — Telehealth: Payer: Self-pay | Admitting: Family Medicine

## 2013-11-11 MED ORDER — DEXLANSOPRAZOLE 60 MG PO CPDR: 60.0000 mg | DELAYED_RELEASE_CAPSULE | Freq: Every evening | ORAL | Status: DC

## 2013-11-11 NOTE — Telephone Encounter (Signed)
Rx sent to pharmacy.  #30 x 3 rfs.

## 2013-11-11 NOTE — Telephone Encounter (Signed)
Refill- dexilant

## 2013-12-22 ENCOUNTER — Other Ambulatory Visit: Payer: BC Managed Care – PPO

## 2013-12-22 ENCOUNTER — Encounter: Payer: Self-pay | Admitting: Nurse Practitioner

## 2013-12-22 ENCOUNTER — Ambulatory Visit (INDEPENDENT_AMBULATORY_CARE_PROVIDER_SITE_OTHER): Payer: BC Managed Care – PPO | Admitting: Nurse Practitioner

## 2013-12-22 VITALS — BP 124/94 | HR 92 | Temp 98.1°F | Ht 63.5 in | Wt 160.0 lb

## 2013-12-22 DIAGNOSIS — K219 Gastro-esophageal reflux disease without esophagitis: Secondary | ICD-10-CM

## 2013-12-22 DIAGNOSIS — Z0000 Encounter for general adult medical examination without abnormal findings: Secondary | ICD-10-CM

## 2013-12-22 DIAGNOSIS — IMO0001 Reserved for inherently not codable concepts without codable children: Secondary | ICD-10-CM

## 2013-12-22 DIAGNOSIS — R030 Elevated blood-pressure reading, without diagnosis of hypertension: Secondary | ICD-10-CM

## 2013-12-22 DIAGNOSIS — G4700 Insomnia, unspecified: Secondary | ICD-10-CM

## 2013-12-22 DIAGNOSIS — E785 Hyperlipidemia, unspecified: Secondary | ICD-10-CM

## 2013-12-22 LAB — CBC
HEMATOCRIT: 42.6 % (ref 36.0–46.0)
HEMOGLOBIN: 14.4 g/dL (ref 12.0–15.0)
MCHC: 33.9 g/dL (ref 30.0–36.0)
MCV: 92.7 fl (ref 78.0–100.0)
Platelets: 232 10*3/uL (ref 150.0–400.0)
RBC: 4.6 Mil/uL (ref 3.87–5.11)
RDW: 12.4 % (ref 11.5–15.5)
WBC: 7 10*3/uL (ref 4.0–10.5)

## 2013-12-22 LAB — URINALYSIS, MICROSCOPIC ONLY: RBC / HPF: NONE SEEN (ref 0–?)

## 2013-12-22 LAB — HEMOGLOBIN A1C: Hgb A1c MFr Bld: 5.2 % (ref 4.6–6.5)

## 2013-12-22 LAB — COMPREHENSIVE METABOLIC PANEL
ALT: 23 U/L (ref 0–35)
AST: 20 U/L (ref 0–37)
Albumin: 4.2 g/dL (ref 3.5–5.2)
Alkaline Phosphatase: 37 U/L — ABNORMAL LOW (ref 39–117)
BILIRUBIN TOTAL: 0.6 mg/dL (ref 0.2–1.2)
BUN: 12 mg/dL (ref 6–23)
CO2: 26 mEq/L (ref 19–32)
CREATININE: 0.8 mg/dL (ref 0.4–1.2)
Calcium: 9.5 mg/dL (ref 8.4–10.5)
Chloride: 101 mEq/L (ref 96–112)
GFR: 87.18 mL/min (ref >60.00)
GLUCOSE: 91 mg/dL (ref 70–99)
Potassium: 4 mEq/L (ref 3.5–5.1)
Sodium: 137 mEq/L (ref 135–145)
Total Protein: 7.1 g/dL (ref 6.0–8.3)

## 2013-12-22 LAB — LIPID PANEL
CHOL/HDL RATIO: 4
Cholesterol: 232 mg/dL — ABNORMAL HIGH (ref 0–200)
HDL: 54.9 mg/dL (ref >39.00)
LDL CALC: 111 mg/dL — AB (ref 0–99)
NonHDL: 177.1
Triglycerides: 333 mg/dL — ABNORMAL HIGH (ref 0.0–149.0)
VLDL: 66.6 mg/dL — ABNORMAL HIGH (ref 0.0–40.0)

## 2013-12-22 LAB — VITAMIN D 25 HYDROXY (VIT D DEFICIENCY, FRACTURES): VITD: 35.4 ng/mL

## 2013-12-22 LAB — TSH: TSH: 0.88 u[IU]/mL (ref 0.35–4.50)

## 2013-12-22 LAB — VITAMIN B12: VITAMIN B 12: 394 pg/mL (ref 211–911)

## 2013-12-22 MED ORDER — LORAZEPAM 0.5 MG PO TABS: 0.5000 mg | ORAL_TABLET | Freq: Two times a day (BID) | ORAL | Status: DC | PRN

## 2013-12-22 MED ORDER — DEXLANSOPRAZOLE 60 MG PO CPDR: 60.0000 mg | DELAYED_RELEASE_CAPSULE | Freq: Every evening | ORAL | Status: DC

## 2013-12-22 MED ORDER — TRAZODONE HCL 50 MG PO TABS: 50.0000 mg | ORAL_TABLET | Freq: Every day | ORAL | Status: DC

## 2013-12-22 NOTE — Assessment & Plan Note (Signed)
Taking xanax & trazadone every night. Discussed correlation w/early dementia & LT. Xanax use.  Recmd stop xanax & start ativan.

## 2013-12-22 NOTE — Assessment & Plan Note (Signed)
On arrival 140/94. At end of visit 120/92. Tried BP med in past, did not like SE. Recmd monitor. Call if consistently over 140/90.

## 2013-12-22 NOTE — Progress Notes (Signed)
Pre visit review using our clinic review tool, if applicable. No additional management support is needed unless otherwise documented below in the visit note. 

## 2013-12-22 NOTE — Assessment & Plan Note (Addendum)
Taking high dose dexilant 60 mg.  Is not interested in decreasing dose.  Taking probiotics, but also takes prophylactic ABX for cystitis. Separate probiotics & aBX by 2 hrs. Start magnesium qd 250 mg.

## 2013-12-22 NOTE — Progress Notes (Signed)
Subjective:     Brittney Townsend is a 43 y.o. female and is here for a comprehensive physical exam. She is seeing gynecology for woman care-had exam 4/15. She requests refills on dexilant, xanax & trazadone. The patient reports no problems.  History   Social History  . Marital Status: Divorced    Spouse Name: N/A    Number of Children: N/A  . Years of Education: N/A   Occupational History  . Not on file.   Social History Main Topics  . Smoking status: Never Smoker   . Smokeless tobacco: Never Used  . Alcohol Use: Yes     Comment: rare, special occasions  . Drug Use: No  . Sexual Activity: Not on file   Other Topics Concern  . Not on file   Social History Narrative  . No narrative on file   Health Maintenance  Topic Date Due  . Influenza Vaccine  02/12/2014  . Pap Smear  10/14/2014  . Tetanus/tdap  04/30/2021    The following portions of the patient's history were reviewed and updated as appropriate: allergies, current medications, past family history, past medical history, past surgical history and problem list.  Review of Systems Pertinent items are noted in HPI.   Objective:    BP 124/94  Pulse 92  Temp(Src) 98.1 F (36.7 C) (Temporal)  Ht 5' 3.5" (1.613 m)  Wt 160 lb (72.576 kg)  BMI 27.89 kg/m2  SpO2 98% General appearance: alert, cooperative, appears stated age and no distress Head: Normocephalic, without obvious abnormality, atraumatic Eyes: negative findings: lids and lashes normal and conjunctivae and sclerae normal Ears: normal TM's and external ear canals both ears Throat: lips, mucosa, and tongue normal; teeth and gums normal Lungs: clear to auscultation bilaterally Heart: regular rate and rhythm, S1, S2 normal, no murmur, click, rub or gallop Abdomen: soft, non-tender; bowel sounds normal; no masses,  no organomegaly Extremities: extremities normal, atraumatic, no cyanosis or edema Pulses: 2+ and symmetric Skin: Skin color, texture, turgor normal.  No rashes or lesions Neurologic: Grossly normal    Assessment:   1. Insomnia Stop xanax due to association w/dementia & Lt use - traZODone (DESYREL) 50 MG tablet; Take 1 tablet (50 mg total) by mouth at bedtime.  Dispense: 30 tablet; Refill: 2 - LORazepam (ATIVAN) 0.5 MG tablet; Take 1 tablet (0.5 mg total) by mouth 2 (two) times daily as needed for anxiety.  Dispense: 60 tablet; Refill: 1  2. Preventative health care Continue to see gyn for womancare - CBC - Hemoglobin A1c - Lipid panel - TSH - Vit D  25 hydroxy (rtn osteoporosis monitoring) - Comprehensive metabolic panel - Urine Microscopic - Vitamin B12  3. GERD (gastroesophageal reflux disease) - dexlansoprazole (DEXILANT) 60 MG capsule; Take 1 capsule (60 mg total) by mouth every evening.  Dispense: 30 capsule; Refill: 3 - Vitamin B12  4. Elevated BP Monitor at home, instructions given  5. Hyperlipidemia Hx Trg over 800  See problem list for complete A&P F/u 6 mo

## 2013-12-22 NOTE — Assessment & Plan Note (Signed)
Vaccines UTD. Pap w/gynecology 4/15 nml, MMG 4/15 nml. Screening labs today.

## 2013-12-22 NOTE — Patient Instructions (Addendum)
My office will call with lab results and any necessary follow up/changes. Stop xanax, start ativan- you may take 1/2 tab to 1 T twice daily as needed. Consider taking dexilant every other day. Continue probiotics-it is best to take a combination of probiotics like Align and culterelle-alternate daily. 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 Start magnesium supplement at bedtime-250 mg. If it causes diarrhea, cut in half. Please keep an eye on blood pressure. It will cause damage to small vessels & nerves if it stays over 140/90-both numbers are important. Great job with exercise & diet changes!! Nice to see you. You look fabulous!  Preventive Care for Adults, Female A healthy lifestyle and preventive care can promote health and wellness. Preventive health guidelines for women include the following key practices.  A routine yearly physical is a good way to check with your health care provider about your health and preventive screening. It is a chance to share any concerns and updates on your health and to receive a thorough exam.  Visit your dentist for a routine exam and preventive care every 6 months. Brush your teeth twice a day and floss once a day. Good oral hygiene prevents tooth decay and gum disease.  The frequency of eye exams is based on your age, health, family medical history, use of contact lenses, and other factors. Follow your health care provider's recommendations for frequency of eye exams.  Eat a healthy diet. Foods like vegetables, fruits, whole grains, low-fat dairy products, and lean protein foods contain the nutrients you need without too many calories. Decrease your intake of foods high in solid fats, added sugars, and salt. Eat the right amount of calories for you.Get information about a proper diet from your health care provider, if necessary.  Regular physical exercise is one of the most important things you can do  for your health. Most adults should get at least 150 minutes of moderate-intensity exercise (any activity that increases your heart rate and causes you to sweat) each week. In addition, most adults need muscle-strengthening exercises on 2 or more days a week.  Maintain a healthy weight. The body mass index (BMI) is a screening tool to identify possible weight problems. It provides an estimate of body fat based on height and weight. Your health care provider can find your BMI, and can help you achieve or maintain a healthy weight.For adults 20 years and older:  A BMI below 18.5 is considered underweight.  A BMI of 18.5 to 24.9 is normal.  A BMI of 25 to 29.9 is considered overweight.  A BMI of 30 and above is considered obese.  Maintain normal blood lipids and cholesterol levels by exercising and minimizing your intake of saturated fat. Eat a balanced diet with plenty of fruit and vegetables. Blood tests for lipids and cholesterol should begin at age 5 and be repeated every 5 years. If your lipid or cholesterol levels are high, you are over 50, or you are at high risk for heart disease, you may need your cholesterol levels checked more frequently.Ongoing high lipid and cholesterol levels should be treated with medicines if diet and exercise are not working.  If you smoke, find out from your health care provider how to quit. If you do not use tobacco, do not start.  Lung cancer screening is recommended for adults aged 46 80 years who are at high risk for developing lung cancer because of a history of smoking.  A yearly low-dose CT scan of the lungs is recommended for people who have at least a 30-pack-year history of smoking and are a current smoker or have quit within the past 15 years. A pack year of smoking is smoking an average of 1 pack of cigarettes a day for 1 year (for example: 1 pack a day for 30 years or 2 packs a day for 15 years). Yearly screening should continue until the smoker has  stopped smoking for at least 15 years. Yearly screening should be stopped for people who develop a health problem that would prevent them from having lung cancer treatment.  If you are pregnant, do not drink alcohol. If you are breastfeeding, be very cautious about drinking alcohol. If you are not pregnant and choose to drink alcohol, do not have more than 1 drink per day. One drink is considered to be 12 ounces (355 mL) of beer, 5 ounces (148 mL) of wine, or 1.5 ounces (44 mL) of liquor.  Avoid use of street drugs. Do not share needles with anyone. Ask for help if you need support or instructions about stopping the use of drugs.  High blood pressure causes heart disease and increases the risk of stroke. Your blood pressure should be checked at least every 1 to 2 years. Ongoing high blood pressure should be treated with medicines if weight loss and exercise do not work.  If you are 20 43 years old, ask your health care provider if you should take aspirin to prevent strokes.  Diabetes screening involves taking a blood sample to check your fasting blood sugar level. This should be done once every 3 years, after age 54, if you are within normal weight and without risk factors for diabetes. Testing should be considered at a younger age or be carried out more frequently if you are overweight and have at least 1 risk factor for diabetes.  Breast cancer screening is essential preventive care for women. You should practice "breast self-awareness." This means understanding the normal appearance and feel of your breasts and may include breast self-examination. Any changes detected, no matter how small, should be reported to a health care provider. Women in their 70s and 30s should have a clinical breast exam (CBE) by a health care provider as part of a regular health exam every 1 to 3 years. After age 67, women should have a CBE every year. Starting at age 82, women should consider having a mammogram (breast X-ray  test) every year. Women who have a family history of breast cancer should talk to their health care provider about genetic screening. Women at a high risk of breast cancer should talk to their health care providers about having an MRI and a mammogram every year.  Breast cancer gene (BRCA)-related cancer risk assessment is recommended for women who have family members with BRCA-related cancers. BRCA-related cancers include breast, ovarian, tubal, and peritoneal cancers. Having family members with these cancers may be associated with an increased risk for harmful changes (mutations) in the breast cancer genes BRCA1 and BRCA2. Results of the assessment will determine the need for genetic counseling and BRCA1 and BRCA2 testing.  The Pap test is a screening test for cervical cancer. A Pap test can show cell changes on the cervix that might become cervical cancer if left untreated. A Pap test is a procedure in which cells are obtained and examined from the lower end of the uterus (cervix).  Women should have a Pap test starting at age  21.  Between ages 81 and 14, Pap tests should be repeated every 2 years.  Beginning at age 33, you should have a Pap test every 3 years as long as the past 3 Pap tests have been normal.  Some women have medical problems that increase the chance of getting cervical cancer. Talk to your health care provider about these problems. It is especially important to talk to your health care provider if a new problem develops soon after your last Pap test. In these cases, your health care provider may recommend more frequent screening and Pap tests.  The above recommendations are the same for women who have or have not gotten the vaccine for human papillomavirus (HPV).  If you had a hysterectomy for a problem that was not cancer or a condition that could lead to cancer, then you no longer need Pap tests. Even if you no longer need a Pap test, a regular exam is a good idea to make sure  no other problems are starting.  If you are between ages 88 and 88 years, and you have had normal Pap tests going back 10 years, you no longer need Pap tests. Even if you no longer need a Pap test, a regular exam is a good idea to make sure no other problems are starting.  If you have had past treatment for cervical cancer or a condition that could lead to cancer, you need Pap tests and screening for cancer for at least 20 years after your treatment.  If Pap tests have been discontinued, risk factors (such as a new sexual partner) need to be reassessed to determine if screening should be resumed.  The HPV test is an additional test that may be used for cervical cancer screening. The HPV test looks for the virus that can cause the cell changes on the cervix. The cells collected during the Pap test can be tested for HPV. The HPV test could be used to screen women aged 22 years and older, and should be used in women of any age who have unclear Pap test results. After the age of 103, women should have HPV testing at the same frequency as a Pap test.  Colorectal cancer can be detected and often prevented. Most routine colorectal cancer screening begins at the age of 22 years and continues through age 52 years. However, your health care provider may recommend screening at an earlier age if you have risk factors for colon cancer. On a yearly basis, your health care provider may provide home test kits to check for hidden blood in the stool. Use of a small camera at the end of a tube, to directly examine the colon (sigmoidoscopy or colonoscopy), can detect the earliest forms of colorectal cancer. Talk to your health care provider about this at age 95, when routine screening begins. Direct exam of the colon should be repeated every 5 10 years through age 78 years, unless early forms of pre-cancerous polyps or small growths are found.  People who are at an increased risk for hepatitis B should be screened for this  virus. You are considered at high risk for hepatitis B if:  You were born in a country where hepatitis B occurs often. Talk with your health care provider about which countries are considered high risk.  Your parents were born in a high-risk country and you have not received a shot to protect against hepatitis B (hepatitis B vaccine).  You have HIV or AIDS.  You use needles  to inject street drugs.  You live with, or have sex with, someone who has Hepatitis B.  You get hemodialysis treatment.  You take certain medicines for conditions like cancer, organ transplantation, and autoimmune conditions.  Hepatitis C blood testing is recommended for all people born from [birth year] through 1965 and any individual with known risks for hepatitis C.  Practice safe sex. Use condoms and avoid high-risk sexual practices to reduce the spread of sexually transmitted infections (STIs). STIs include gonorrhea, chlamydia, syphilis, trichomonas, herpes, HPV, and human immunodeficiency virus (HIV). Herpes, HIV, and HPV are viral illnesses that have no cure. They can result in disability, cancer, and death. Sexually active women aged 32 years and younger should be checked for chlamydia. Older women with new or multiple partners should also be tested for chlamydia. Testing for other STIs is recommended if you are sexually active and at increased risk.  Osteoporosis is a disease in which the bones lose minerals and strength with aging. This can result in serious bone fractures or breaks. The risk of osteoporosis can be identified using a bone density scan. Women ages 57 years and over and women at risk for fractures or osteoporosis should discuss screening with their health care providers. Ask your health care provider whether you should take a calcium supplement or vitamin D to reduce the rate of osteoporosis.  Menopause can be associated with physical symptoms and risks. Hormone replacement therapy is available to decrease  symptoms and risks. You should talk to your health care provider about whether hormone replacement therapy is right for you.  Use sunscreen. Apply sunscreen liberally and repeatedly throughout the day. You should seek shade when your shadow is shorter than you. Protect yourself by wearing long sleeves, pants, a wide-brimmed hat, and sunglasses year round, whenever you are outdoors.  Once a month, do a whole body skin exam, using a mirror to look at the skin on your back. Tell your health care provider of new moles, moles that have irregular borders, moles that are larger than a pencil eraser, or moles that have changed in shape or color.  Stay current with required vaccines (immunizations).  Influenza vaccine. All adults should be immunized every year.  Tetanus, diphtheria, and acellular pertussis (Td, Tdap) vaccine. Pregnant women should receive 1 dose of Tdap vaccine during each pregnancy. The dose should be obtained regardless of the length of time since the last dose. Immunization is preferred during the 27th 36th week of gestation. An adult who has not previously received Tdap or who does not know her vaccine status should receive 1 dose of Tdap. This initial dose should be followed by tetanus and diphtheria toxoids (Td) booster doses every 10 years. Adults with an unknown or incomplete history of completing a 3-dose immunization series with Td-containing vaccines should begin or complete a primary immunization series including a Tdap dose. Adults should receive a Td booster every 10 years.  Varicella vaccine. An adult without evidence of immunity to varicella should receive 2 doses or a second dose if she has previously received 1 dose. Pregnant females who do not have evidence of immunity should receive the first dose after pregnancy. This first dose should be obtained before leaving the health care facility. The second dose should be obtained 4 8 weeks after the first dose.  Human  papillomavirus (HPV) vaccine. Females aged 110 26 years who have not received the vaccine previously should obtain the 3-dose series. The vaccine is not recommended for use in pregnant females.  However, pregnancy testing is not needed before receiving a dose. If a female is found to be pregnant after receiving a dose, no treatment is needed. In that case, the remaining doses should be delayed until after the pregnancy. Immunization is recommended for any person with an immunocompromised condition through the age of 52 years if she did not get any or all doses earlier. During the 3-dose series, the second dose should be obtained 4 8 weeks after the first dose. The third dose should be obtained 24 weeks after the first dose and 16 weeks after the second dose.  Zoster vaccine. One dose is recommended for adults aged 43 years or older unless certain conditions are present.  Measles, mumps, and rubella (MMR) vaccine. Adults born before 2 generally are considered immune to measles and mumps. Adults born in [birth year] or later should have 1 or more doses of MMR vaccine unless there is a contraindication to the vaccine or there is laboratory evidence of immunity to each of the three diseases. A routine second dose of MMR vaccine should be obtained at least 28 days after the first dose for students attending postsecondary schools, health care workers, or international travelers. People who received inactivated measles vaccine or an unknown type of measles vaccine during 1963 1967 should receive 2 doses of MMR vaccine. People who received inactivated mumps vaccine or an unknown type of mumps vaccine before 1979 and are at high risk for mumps infection should consider immunization with 2 doses of MMR vaccine. For females of childbearing age, rubella immunity should be determined. If there is no evidence of immunity, females who are not pregnant should be vaccinated. If there is no evidence of immunity, females who are pregnant  should delay immunization until after pregnancy. Unvaccinated health care workers born before 2 who lack laboratory evidence of measles, mumps, or rubella immunity or laboratory confirmation of disease should consider measles and mumps immunization with 2 doses of MMR vaccine or rubella immunization with 1 dose of MMR vaccine.  Pneumococcal 13-valent conjugate (PCV13) vaccine. When indicated, a person who is uncertain of her immunization history and has no record of immunization should receive the PCV13 vaccine. An adult aged 43 years or older who has certain medical conditions and has not been previously immunized should receive 1 dose of PCV13 vaccine. This PCV13 should be followed with a dose of pneumococcal polysaccharide (PPSV23) vaccine. The PPSV23 vaccine dose should be obtained at least 8 weeks after the dose of PCV13 vaccine. An adult aged 65 years or older who has certain medical conditions and previously received 1 or more doses of PPSV23 vaccine should receive 1 dose of PCV13. The PCV13 vaccine dose should be obtained 1 or more years after the last PPSV23 vaccine dose.  Pneumococcal polysaccharide (PPSV23) vaccine. When PCV13 is also indicated, PCV13 should be obtained first. All adults aged 88 years and older should be immunized. An adult younger than age 76 years who has certain medical conditions should be immunized. Any person who resides in a nursing home or long-term care facility should be immunized. An adult smoker should be immunized. People with an immunocompromised condition and certain other conditions should receive both PCV13 and PPSV23 vaccines. People with human immunodeficiency virus (HIV) infection should be immunized as soon as possible after diagnosis. Immunization during chemotherapy or radiation therapy should be avoided. Routine use of PPSV23 vaccine is not recommended for American Indians, [place] Natives, or people younger than 65 years unless there are medical conditions  that require PPSV23 vaccine. When indicated, people who have unknown immunization and have no record of immunization should receive PPSV23 vaccine. One-time revaccination 5 years after the first dose of PPSV23 is recommended for people aged 85 64 years who have chronic kidney failure, nephrotic syndrome, asplenia, or immunocompromised conditions. People who received 1 2 doses of PPSV23 before age 69 years should receive another dose of PPSV23 vaccine at age 22 years or later if at least 5 years have passed since the previous dose. Doses of PPSV23 are not needed for people immunized with PPSV23 at or after age 11 years.  Meningococcal vaccine. Adults with asplenia or persistent complement component deficiencies should receive 2 doses of quadrivalent meningococcal conjugate (MenACWY-D) vaccine. The doses should be obtained at least 2 months apart. Microbiologists working with certain meningococcal bacteria, [place] recruits, people at risk during an outbreak, and people who travel to or live in countries with a high rate of meningitis should be immunized. A first-year college student up through age 71 years who is living in a residence hall should receive a dose if she did not receive a dose on or after her 16th birthday. Adults who have certain high-risk conditions should receive one or more doses of vaccine.  Hepatitis A vaccine. Adults who wish to be protected from this disease, have certain high-risk conditions, work with hepatitis A-infected animals, work in hepatitis A research labs, or travel to or work in countries with a high rate of hepatitis A should be immunized. Adults who were previously unvaccinated and who anticipate close contact with an international adoptee during the first 60 days after arrival in the [place] from a country with a high rate of hepatitis A should be immunized.  Hepatitis B vaccine. Adults who wish to be protected from this disease, have certain high-risk conditions, may  be exposed to blood or other infectious body fluids, are household contacts or sex partners of hepatitis B positive people, are clients or workers in certain care facilities, or travel to or work in countries with a high rate of hepatitis B should be immunized.  Haemophilus influenzae type b (Hib) vaccine. A previously unvaccinated person with asplenia or sickle cell disease or having a scheduled splenectomy should receive 1 dose of Hib vaccine. Regardless of previous immunization, a recipient of a hematopoietic stem cell transplant should receive a 3-dose series 6 12 months after her successful transplant. Hib vaccine is not recommended for adults with HIV infection. Preventive Services / Frequency Ages 35 to 39years  Blood pressure check.** / Every 1 to 2 years.  Lipid and cholesterol check.** / Every 5 years beginning at age 52.  Clinical breast exam.** / Every 3 years for women in their 42s and 50s.  BRCA-related cancer risk assessment.** / For women who have family members with a BRCA-related cancer (breast, ovarian, tubal, or peritoneal cancers).  Pap test.** / Every 2 years from ages 72 through 80. Every 3 years starting at age 61 through age 85 or 10 with a history of 3 consecutive normal Pap tests.  HPV screening.** / Every 3 years from ages 86 through ages 39 to 20 with a history of 3 consecutive normal Pap tests.  Hepatitis C blood test.** / For any individual with known risks for hepatitis C.  Skin self-exam. / Monthly.  Influenza vaccine. / Every year.  Tetanus, diphtheria, and acellular pertussis (Tdap, Td) vaccine.** / Consult your health care provider. Pregnant women should receive 1 dose of Tdap vaccine during each  pregnancy. 1 dose of Td every 10 years.  Varicella vaccine.** / Consult your health care provider. Pregnant females who do not have evidence of immunity should receive the first dose after pregnancy.  HPV vaccine. / 3 doses over 6 months, if 65 and younger. The  vaccine is not recommended for use in pregnant females. However, pregnancy testing is not needed before receiving a dose.  Measles, mumps, rubella (MMR) vaccine.** / You need at least 1 dose of MMR if you were born in 1957 or later. You may also need a 2nd dose. For females of childbearing age, rubella immunity should be determined. If there is no evidence of immunity, females who are not pregnant should be vaccinated. If there is no evidence of immunity, females who are pregnant should delay immunization until after pregnancy.  Pneumococcal 13-valent conjugate (PCV13) vaccine.** / Consult your health care provider.  Pneumococcal polysaccharide (PPSV23) vaccine.** / 1 to 2 doses if you smoke cigarettes or if you have certain conditions.  Meningococcal vaccine.** / 1 dose if you are age 48 to 71 years and a Market researcher living in a residence hall, or have one of several medical conditions, you need to get vaccinated against meningococcal disease. You may also need additional booster doses.  Hepatitis A vaccine.** / Consult your health care provider.  Hepatitis B vaccine.** / Consult your health care provider.  Haemophilus influenzae type b (Hib) vaccine.** / Consult your health care provider. Ages 35 to 64years  Blood pressure check.** / Every 1 to 2 years.  Lipid and cholesterol check.** / Every 5 years beginning at age 8 years.  Lung cancer screening. / Every year if you are aged 6 80 years and have a 30-pack-year history of smoking and currently smoke or have quit within the past 15 years. Yearly screening is stopped once you have quit smoking for at least 15 years or develop a health problem that would prevent you from having lung cancer treatment.  Clinical breast exam.** / Every year after age 41 years.  BRCA-related cancer risk assessment.** / For women who have family members with a BRCA-related cancer (breast, ovarian, tubal, or peritoneal cancers).  Mammogram.** /  Every year beginning at age 38 years and continuing for as long as you are in good health. Consult with your health care provider.  Pap test.** / Every 3 years starting at age 21 years through age 37 or 70 years with a history of 3 consecutive normal Pap tests.  HPV screening.** / Every 3 years from ages 42 years through ages 10 to 22 years with a history of 3 consecutive normal Pap tests.  Fecal occult blood test (FOBT) of stool. / Every year beginning at age 76 years and continuing until age 54 years. You may not need to do this test if you get a colonoscopy every 10 years.  Flexible sigmoidoscopy or colonoscopy.** / Every 5 years for a flexible sigmoidoscopy or every 10 years for a colonoscopy beginning at age 48 years and continuing until age 24 years.  Hepatitis C blood test.** / For all people born from [birth year] through 1965 and any individual with known risks for hepatitis C.  Skin self-exam. / Monthly.  Influenza vaccine. / Every year.  Tetanus, diphtheria, and acellular pertussis (Tdap/Td) vaccine.** / Consult your health care provider. Pregnant women should receive 1 dose of Tdap vaccine during each pregnancy. 1 dose of Td every 10 years.  Varicella vaccine.** / Consult your health care provider. Pregnant females who  do not have evidence of immunity should receive the first dose after pregnancy.  Zoster vaccine.** / 1 dose for adults aged 53 years or older.  Measles, mumps, rubella (MMR) vaccine.** / You need at least 1 dose of MMR if you were born in 1957 or later. You may also need a 2nd dose. For females of childbearing age, rubella immunity should be determined. If there is no evidence of immunity, females who are not pregnant should be vaccinated. If there is no evidence of immunity, females who are pregnant should delay immunization until after pregnancy.  Pneumococcal 13-valent conjugate (PCV13) vaccine.** / Consult your health care provider.  Pneumococcal polysaccharide  (PPSV23) vaccine.** / 1 to 2 doses if you smoke cigarettes or if you have certain conditions.  Meningococcal vaccine.** / Consult your health care provider.  Hepatitis A vaccine.** / Consult your health care provider.  Hepatitis B vaccine.** / Consult your health care provider.  Haemophilus influenzae type b (Hib) vaccine.** / Consult your health care provider. Ages 13 years and over  Blood pressure check.** / Every 1 to 2 years.  Lipid and cholesterol check.** / Every 5 years beginning at age 69 years.  Lung cancer screening. / Every year if you are aged 50 80 years and have a 30-pack-year history of smoking and currently smoke or have quit within the past 15 years. Yearly screening is stopped once you have quit smoking for at least 15 years or develop a health problem that would prevent you from having lung cancer treatment.  Clinical breast exam.** / Every year after age 62 years.  BRCA-related cancer risk assessment.** / For women who have family members with a BRCA-related cancer (breast, ovarian, tubal, or peritoneal cancers).  Mammogram.** / Every year beginning at age 44 years and continuing for as long as you are in good health. Consult with your health care provider.  Pap test.** / Every 3 years starting at age 56 years through age 76 or 17 years with 3 consecutive normal Pap tests. Testing can be stopped between 65 and 70 years with 3 consecutive normal Pap tests and no abnormal Pap or HPV tests in the past 10 years.  HPV screening.** / Every 3 years from ages 80 years through ages 54 or 76 years with a history of 3 consecutive normal Pap tests. Testing can be stopped between 65 and 70 years with 3 consecutive normal Pap tests and no abnormal Pap or HPV tests in the past 10 years.  Fecal occult blood test (FOBT) of stool. / Every year beginning at age 13 years and continuing until age 79 years. You may not need to do this test if you get a colonoscopy every 10  years.  Flexible sigmoidoscopy or colonoscopy.** / Every 5 years for a flexible sigmoidoscopy or every 10 years for a colonoscopy beginning at age 83 years and continuing until age 8 years.  Hepatitis C blood test.** / For all people born from [birth year] through 1965 and any individual with known risks for hepatitis C.  Osteoporosis screening.** / A one-time screening for women ages 20 years and over and women at risk for fractures or osteoporosis.  Skin self-exam. / Monthly.  Influenza vaccine. / Every year.  Tetanus, diphtheria, and acellular pertussis (Tdap/Td) vaccine.** / 1 dose of Td every 10 years.  Varicella vaccine.** / Consult your health care provider.  Zoster vaccine.** / 1 dose for adults aged 43 years or older.  Pneumococcal 13-valent conjugate (PCV13) vaccine.** / Consult your health  care provider.  Pneumococcal polysaccharide (PPSV23) vaccine.** / 1 dose for all adults aged 59 years and older.  Meningococcal vaccine.** / Consult your health care provider.  Hepatitis A vaccine.** / Consult your health care provider.  Hepatitis B vaccine.** / Consult your health care provider.  Haemophilus influenzae type b (Hib) vaccine.** / Consult your health care provider. ** Family history and personal history of risk and conditions may change your health care provider's recommendations. Document Released: 08/27/2001 Document Revised: 04/21/2013 Document Reviewed: 11/26/2010 [facility] Patient Information 2014 [place], [place].

## 2013-12-22 NOTE — Assessment & Plan Note (Signed)
Made diet changes, exercising.  Lost 20 lbs. Lipids today.

## 2013-12-23 ENCOUNTER — Telehealth: Payer: Self-pay | Admitting: Nurse Practitioner

## 2013-12-23 NOTE — Telephone Encounter (Signed)
Labs look good: lipids-trig high but improved. HDL improved. 10 Yr risk for developing ASCVD is .82%, so no statin. B 12 borderline-recmd daily B complex Vit D low-recmd 1000 iu D3 daily Check vit levels in 6 mos.

## 2013-12-29 ENCOUNTER — Encounter: Payer: BC Managed Care – PPO | Admitting: Nurse Practitioner

## 2014-02-15 ENCOUNTER — Other Ambulatory Visit: Payer: Self-pay | Admitting: Family Medicine

## 2014-02-15 NOTE — Telephone Encounter (Signed)
Please advise refills 

## 2014-02-16 NOTE — Telephone Encounter (Signed)
pls adv pt these are not meds that I refill. She needs appt. To diagnose vaginal yeast and/or BV in order to be prescribed these meds.

## 2014-03-28 ENCOUNTER — Ambulatory Visit (INDEPENDENT_AMBULATORY_CARE_PROVIDER_SITE_OTHER): Payer: BC Managed Care – PPO | Admitting: Nurse Practitioner

## 2014-03-28 ENCOUNTER — Encounter: Payer: Self-pay | Admitting: Nurse Practitioner

## 2014-03-28 ENCOUNTER — Other Ambulatory Visit: Payer: Self-pay | Admitting: Nurse Practitioner

## 2014-03-28 VITALS — BP 114/80 | HR 83 | Temp 98.1°F | Resp 18 | Ht 63.5 in | Wt 161.0 lb

## 2014-03-28 DIAGNOSIS — R3915 Urgency of urination: Secondary | ICD-10-CM

## 2014-03-28 DIAGNOSIS — B9689 Other specified bacterial agents as the cause of diseases classified elsewhere: Secondary | ICD-10-CM

## 2014-03-28 DIAGNOSIS — N760 Acute vaginitis: Secondary | ICD-10-CM

## 2014-03-28 DIAGNOSIS — N898 Other specified noninflammatory disorders of vagina: Secondary | ICD-10-CM

## 2014-03-28 DIAGNOSIS — A499 Bacterial infection, unspecified: Secondary | ICD-10-CM

## 2014-03-28 DIAGNOSIS — B3731 Acute candidiasis of vulva and vagina: Secondary | ICD-10-CM

## 2014-03-28 DIAGNOSIS — N899 Noninflammatory disorder of vagina, unspecified: Secondary | ICD-10-CM

## 2014-03-28 DIAGNOSIS — B373 Candidiasis of vulva and vagina: Secondary | ICD-10-CM

## 2014-03-28 LAB — POCT URINALYSIS DIPSTICK
BILIRUBIN UA: NEGATIVE
Glucose, UA: NEGATIVE
Ketones, UA: NEGATIVE
Nitrite, UA: NEGATIVE
PH UA: 6
PROTEIN UA: NEGATIVE
RBC UA: NEGATIVE
Spec Grav, UA: 1.015
UROBILINOGEN UA: 0.2

## 2014-03-28 LAB — POCT WET PREP WITH KOH
Clue Cells Wet Prep HPF POC: POSITIVE
KOH Prep POC: POSITIVE
Trichomonas, UA: NEGATIVE
YEAST WET PREP PER HPF POC: POSITIVE

## 2014-03-28 MED ORDER — METRONIDAZOLE 500 MG PO TABS: 500.0000 mg | ORAL_TABLET | Freq: Two times a day (BID) | ORAL | Status: DC

## 2014-03-28 MED ORDER — FLUCONAZOLE 150 MG PO TABS: 150.0000 mg | ORAL_TABLET | Freq: Once | ORAL | Status: DC

## 2014-03-28 NOTE — Progress Notes (Signed)
Pre visit review using our clinic review tool, if applicable. No additional management support is needed unless otherwise documented below in the visit note. 

## 2014-03-28 NOTE — Progress Notes (Signed)
   Subjective:    Patient ID: Brittney Townsend, female    DOB: 1971/04/03, 43 y.o.   MRN: [id number]  Vaginal Itching The patient's primary symptoms include genital itching and a genital rash (feels raw). The patient's pertinent negatives include no genital lesions, genital odor, vaginal bleeding or vaginal discharge. This is a new problem. The current episode started in the past 7 days (3d). The problem occurs constantly. The problem has been gradually worsening. The patient is experiencing no pain. She is not pregnant. Associated symptoms include urgency. Pertinent negatives include no abdominal pain, back pain, chills, discolored urine, dysuria, fever, flank pain, frequency, hematuria, nausea or rash. Nothing aggravates the symptoms. She has tried nothing for the symptoms. Her past medical history is significant for vaginosis. (Reports frequent vaginal yeast, ICS)      Review of Systems  Constitutional: Negative for fever and chills.  Gastrointestinal: Negative for nausea and abdominal pain.  Genitourinary: Positive for urgency. Negative for dysuria, frequency, hematuria, flank pain and vaginal discharge.       Vaginal irritation   Musculoskeletal: Negative for back pain.  Skin: Negative for rash.       Objective:   Physical Exam  Vitals reviewed. Constitutional: She is oriented to person, place, and time. She appears well-developed and well-nourished. No distress.  HENT:  Head: Normocephalic and atraumatic.  Eyes: Conjunctivae are normal. Right eye exhibits no discharge. Left eye exhibits no discharge.  Cardiovascular: Normal rate.   Pulmonary/Chest: Effort normal. No respiratory distress.  Genitourinary: Vaginal discharge found.  Erythematous labia minora. Moderate thick white d/c in vaginal walls.    Neurological: She is alert and oriented to person, place, and time.  Skin: Skin is warm and dry.  Psychiatric: She has a normal mood and affect. Her behavior is normal. Thought content  normal.          Assessment & Plan:  1. Urinary urgency - POCT urinalysis dipstick-few leuks - Urine culture  2. Vaginal irritation - GC/Chlamydia Probe Amp - POCT Wet Prep with KOH-clue & hyphae  3. Bacterial vaginal infection - metroNIDAZOLE (FLAGYL) 500 MG tablet; Take 1 tablet (500 mg total) by mouth 2 (two) times daily.  Dispense: 14 tablet; Refill: 0  4. Yeast vaginitis - fluconazole (DIFLUCAN) 150 MG tablet; Take 1 tablet (150 mg total) by mouth once. Repeat in 2 weeks if still having symptoms.  Dispense: 2 tablet; Refill: 0  See pt instructions F/u PRN

## 2014-03-28 NOTE — Patient Instructions (Signed)
You have bacterial vaginitis and some yeast causing symptoms. Start medications as directed. If still having symptoms in 2 weeks, repeat diflucan.  No douching. No soap or wipes on vagina for 1 week-just irrigate with warm water, then use mild soap-Dove bar soap.  If still having symptoms in 3 to 4 weeks, let me know.   Bacterial Vaginosis Bacterial vaginosis is a vaginal infection that occurs when the normal balance of bacteria in the vagina is disrupted. It results from an overgrowth of certain bacteria. This is the most common vaginal infection in women of childbearing age. Treatment is important to prevent complications, especially in pregnant women, as it can cause a premature delivery. CAUSES  Bacterial vaginosis is caused by an increase in harmful bacteria that are normally present in smaller amounts in the vagina. Several different kinds of bacteria can cause bacterial vaginosis. However, the reason that the condition develops is not fully understood. RISK FACTORS Certain activities or behaviors can put you at an increased risk of developing bacterial vaginosis, including:  Having a new sex partner or multiple sex partners.  Douching.  Using an intrauterine device (IUD) for contraception. Women do not get bacterial vaginosis from toilet seats, bedding, swimming pools, or contact with objects around them. SIGNS AND SYMPTOMS  Some women with bacterial vaginosis have no signs or symptoms. Common symptoms include:  Grey vaginal discharge.  A fishlike odor with discharge, especially after sexual intercourse.  Itching or burning of the vagina and vulva.  Burning or pain with urination. DIAGNOSIS  Your health care provider will take a medical history and examine the vagina for signs of bacterial vaginosis. A sample of vaginal fluid may be taken. Your health care provider will look at this sample under a microscope to check for bacteria and abnormal cells. A vaginal pH test may also be  done.  TREATMENT  Bacterial vaginosis may be treated with antibiotic medicines. These may be given in the form of a pill or a vaginal cream. A second round of antibiotics may be prescribed if the condition comes back after treatment.  HOME CARE INSTRUCTIONS   Only take over-the-counter or prescription medicines as directed by your health care provider.  If antibiotic medicine was prescribed, take it as directed. Make sure you finish it even if you start to feel better.  Do not have sex until treatment is completed.  Tell all sexual partners that you have a vaginal infection. They should see their health care provider and be treated if they have problems, such as a mild rash or itching.  Practice safe sex by using condoms and only having one sex partner. SEEK MEDICAL CARE IF:   Your symptoms are not improving after 3 days of treatment.  You have increased discharge or pain.  You have a fever. MAKE SURE YOU:   Understand these instructions.  Will watch your condition.  Will get help right away if you are not doing well or get worse. FOR MORE INFORMATION  Centers for Disease Control and Prevention, Division of STD Prevention: SolutionApps.co.za American Sexual Health Association (ASHA): www.ashastd.org  Document Released: 07/01/2005 Document Revised: 04/21/2013 Document Reviewed: 02/10/2013 [facility] Patient Information 2015 [place], [place]. This information is not intended to replace advice given to you by your health care provider. Make sure you discuss any questions you have with your health care provider.

## 2014-03-29 LAB — GC/CHLAMYDIA PROBE AMP
CT Probe RNA: NEGATIVE
GC Probe RNA: NEGATIVE

## 2014-03-30 LAB — URINE CULTURE: Colony Count: 10000

## 2014-03-31 ENCOUNTER — Telehealth: Payer: Self-pay | Admitting: Nurse Practitioner

## 2014-03-31 NOTE — Telephone Encounter (Signed)
pls call pt: Ask if feeling better (treated for BV & vag yeast).

## 2014-03-31 NOTE — Telephone Encounter (Signed)
Spoke with pt, she is doing better but she is still sore.

## 2014-04-13 ENCOUNTER — Other Ambulatory Visit: Payer: Self-pay | Admitting: Nurse Practitioner

## 2014-04-13 ENCOUNTER — Ambulatory Visit: Payer: BC Managed Care – PPO

## 2014-04-13 DIAGNOSIS — B373 Candidiasis of vulva and vagina: Secondary | ICD-10-CM

## 2014-04-13 DIAGNOSIS — G4700 Insomnia, unspecified: Secondary | ICD-10-CM

## 2014-04-13 DIAGNOSIS — B3731 Acute candidiasis of vulva and vagina: Secondary | ICD-10-CM

## 2014-04-13 DIAGNOSIS — G43819 Other migraine, intractable, without status migrainosus: Secondary | ICD-10-CM

## 2014-04-13 DIAGNOSIS — K219 Gastro-esophageal reflux disease without esophagitis: Secondary | ICD-10-CM

## 2014-04-13 MED ORDER — TRAZODONE HCL 100 MG PO TABS: 100.0000 mg | ORAL_TABLET | Freq: Every day | ORAL | Status: DC

## 2014-04-13 MED ORDER — FLUCONAZOLE 150 MG PO TABS: 150.0000 mg | ORAL_TABLET | Freq: Once | ORAL | Status: DC

## 2014-04-13 MED ORDER — SUMATRIPTAN SUCCINATE 100 MG PO TABS: 100.0000 mg | ORAL_TABLET | Freq: Once | ORAL | Status: DC

## 2014-04-13 MED ORDER — LORAZEPAM 0.5 MG PO TABS: 0.5000 mg | ORAL_TABLET | Freq: Two times a day (BID) | ORAL | Status: DC | PRN

## 2014-04-13 MED ORDER — DEXLANSOPRAZOLE 30 MG PO CPDR: 30.0000 mg | DELAYED_RELEASE_CAPSULE | Freq: Every day | ORAL | Status: DC

## 2014-04-14 ENCOUNTER — Ambulatory Visit (INDEPENDENT_AMBULATORY_CARE_PROVIDER_SITE_OTHER): Payer: BC Managed Care – PPO

## 2014-04-14 ENCOUNTER — Other Ambulatory Visit: Payer: Self-pay | Admitting: *Deleted

## 2014-04-14 DIAGNOSIS — Z23 Encounter for immunization: Secondary | ICD-10-CM

## 2014-04-14 NOTE — Telephone Encounter (Signed)
Called [facility] to make sure pharmacy had received rx for lorazepam that faxed on 04/13/2014. Pharmacy stated that they had received.

## 2014-04-26 ENCOUNTER — Other Ambulatory Visit: Payer: Self-pay | Admitting: Family Medicine

## 2014-05-03 ENCOUNTER — Other Ambulatory Visit: Payer: Self-pay | Admitting: *Deleted

## 2014-05-03 NOTE — Telephone Encounter (Signed)
LMOVM

## 2014-05-04 NOTE — Telephone Encounter (Signed)
Spoke to pt about Rx fax we received for 60 mg of Dexilant. Dose has been redused to 30 mg. Patient stated that she had not requested the 60 mg dose.

## 2014-06-17 ENCOUNTER — Other Ambulatory Visit: Payer: Self-pay

## 2014-06-17 DIAGNOSIS — G43819 Other migraine, intractable, without status migrainosus: Secondary | ICD-10-CM

## 2014-06-17 MED ORDER — SUMATRIPTAN SUCCINATE 100 MG PO TABS: 100.0000 mg | ORAL_TABLET | Freq: Once | ORAL | Status: AC

## 2014-06-17 NOTE — Telephone Encounter (Signed)
[facility] requesting refill, Sumatriptan 100 mg. Last OV 03/28/2014.

## 2014-06-23 ENCOUNTER — Ambulatory Visit (INDEPENDENT_AMBULATORY_CARE_PROVIDER_SITE_OTHER): Payer: BC Managed Care – PPO | Admitting: Nurse Practitioner

## 2014-06-23 ENCOUNTER — Encounter: Payer: Self-pay | Admitting: Nurse Practitioner

## 2014-06-23 VITALS — BP 143/90 | HR 68 | Temp 97.8°F | Ht 63.5 in | Wt 162.0 lb

## 2014-06-23 DIAGNOSIS — G4700 Insomnia, unspecified: Secondary | ICD-10-CM

## 2014-06-23 DIAGNOSIS — Z79899 Other long term (current) drug therapy: Secondary | ICD-10-CM | POA: Diagnosis not present

## 2014-06-23 DIAGNOSIS — F411 Generalized anxiety disorder: Secondary | ICD-10-CM

## 2014-06-23 DIAGNOSIS — IMO0001 Reserved for inherently not codable concepts without codable children: Secondary | ICD-10-CM

## 2014-06-23 DIAGNOSIS — K219 Gastro-esophageal reflux disease without esophagitis: Secondary | ICD-10-CM

## 2014-06-23 DIAGNOSIS — E559 Vitamin D deficiency, unspecified: Secondary | ICD-10-CM

## 2014-06-23 DIAGNOSIS — R030 Elevated blood-pressure reading, without diagnosis of hypertension: Secondary | ICD-10-CM | POA: Diagnosis not present

## 2014-06-23 DIAGNOSIS — Z6829 Body mass index (BMI) 29.0-29.9, adult: Secondary | ICD-10-CM

## 2014-06-23 DIAGNOSIS — E785 Hyperlipidemia, unspecified: Secondary | ICD-10-CM | POA: Diagnosis not present

## 2014-06-23 LAB — COMPREHENSIVE METABOLIC PANEL
ALT: 17 U/L (ref 0–35)
AST: 15 U/L (ref 0–37)
Albumin: 3.7 g/dL (ref 3.5–5.2)
Alkaline Phosphatase: 41 U/L (ref 39–117)
BILIRUBIN TOTAL: 0.4 mg/dL (ref 0.2–1.2)
BUN: 10 mg/dL (ref 6–23)
CO2: 27 mEq/L (ref 19–32)
CREATININE: 0.7 mg/dL (ref 0.4–1.2)
Calcium: 9.2 mg/dL (ref 8.4–10.5)
Chloride: 98 mEq/L (ref 96–112)
GFR: 91.05 mL/min (ref >60.00)
GLUCOSE: 96 mg/dL (ref 70–99)
Potassium: 4.3 mEq/L (ref 3.5–5.1)
Sodium: 132 mEq/L — ABNORMAL LOW (ref 135–145)
Total Protein: 6.3 g/dL (ref 6.0–8.3)

## 2014-06-23 LAB — CBC
HEMATOCRIT: 37.5 % (ref 36.0–46.0)
HEMOGLOBIN: 12.7 g/dL (ref 12.0–15.0)
MCHC: 33.8 g/dL (ref 30.0–36.0)
MCV: 91.8 fl (ref 78.0–100.0)
Platelets: 204 10*3/uL (ref 150.0–400.0)
RBC: 4.09 Mil/uL (ref 3.87–5.11)
RDW: 12.3 % (ref 11.5–15.5)
WBC: 7 10*3/uL (ref 4.0–10.5)

## 2014-06-23 LAB — HEMOGLOBIN A1C: Hgb A1c MFr Bld: 5.3 % (ref 4.6–6.5)

## 2014-06-23 LAB — MICROALBUMIN / CREATININE URINE RATIO
CREATININE, U: 134.9 mg/dL
MICROALB/CREAT RATIO: 0.4 mg/g (ref 0.0–30.0)
Microalb, Ur: 0.5 mg/dL (ref 0.0–1.9)

## 2014-06-23 LAB — LIPID PANEL
CHOLESTEROL: 306 mg/dL — AB (ref 0–200)
HDL: 32.1 mg/dL — AB (ref >39.00)
NonHDL: 273.9
Total CHOL/HDL Ratio: 10
Triglycerides: 2127 mg/dL — ABNORMAL HIGH (ref 0.0–149.0)
VLDL: 425.4 mg/dL — AB (ref 0.0–40.0)

## 2014-06-23 LAB — TSH: TSH: 1.13 u[IU]/mL (ref 0.35–4.50)

## 2014-06-23 LAB — MAGNESIUM: Magnesium: 1.7 mg/dL (ref 1.5–2.5)

## 2014-06-23 LAB — LDL CHOLESTEROL, DIRECT: Direct LDL: 21.4 mg/dL

## 2014-06-23 MED ORDER — LORAZEPAM 0.5 MG PO TABS: 0.5000 mg | ORAL_TABLET | Freq: Two times a day (BID) | ORAL | Status: AC | PRN

## 2014-06-23 MED ORDER — DEXLANSOPRAZOLE 30 MG PO CPDR: 30.0000 mg | DELAYED_RELEASE_CAPSULE | Freq: Every day | ORAL | Status: AC

## 2014-06-23 NOTE — Assessment & Plan Note (Signed)
Will start lisinopril if elevated microalb, BUN, creat

## 2014-06-23 NOTE — Patient Instructions (Signed)
Consider eliminating meat from diet for 10 to 14 days to see if you have fewer headaches.  Also, consider reading Eat to Live by Monico HoarJoel Fuhrman for best human nutrition-he discusses food triggers for many chronic conditions.  My office will call with lab results & follow up.  Nice to see you!  Merry Christmas!

## 2014-06-23 NOTE — Progress Notes (Signed)
Pre visit review using our clinic review tool, if applicable. No additional management support is needed unless otherwise documented below in the visit note. 

## 2014-06-23 NOTE — Progress Notes (Signed)
Subjective:     Brittney CaffeyMary Townsend is a 43 y.o. female presents for follow up of elevated blood pressure, insomnia, anxiety, chronic MHA, vit d deficiency, and GERD. Re BP: diastolic consistently at 90. She was on an unk med in past but stopped due to SE of fatigue. She exercises most days of week, is slightly overweight. Other RF include: frequent MHA (1-4/month), meds-imitrex. She is willing to start another med if indicated. Re insomnia-she continues to take trazodone & ativan qhs. Sleeps well with these meds. Requesting refill on ativan. Re anxiety: I d/c'd xanax & started ativan. She takes it bid-states it "really helps". Re chronic MHA: we discussed potential triggers, I voiced concern of stroke risk w/imitrex-she has been taking it about 1/week. She will try to eliminate tyramine for 2 weeks to see if it might be trigger. She tries to avoid nitrates & sulfites. She feels stress is trigger. She has visual aura prior to HA. Re Vit D def: she takes daily supplement-not sure dose. Re Genella RifeGerd: she decreased dose to 30 mg qd from 40 qd. She had few days she had to take 2 tabs. In last month, she has had fewer symptoms, but took TUMS a few times w/relief. She is taking magnesium supplement. Denies diarrhea or constipation.  The following portions of the patient's history were reviewed and updated as appropriate: allergies, current medications, past medical history, past social history, past surgical history and problem list.  Review of Systems Constitutional: negative for fatigue Eyes: negative for contacts/glasses and visual change Ears, nose, mouth, throat, and face: negative for sore throat Respiratory: negative for cough Cardiovascular: negative for irregular heart beat and lower extremity edema Gastrointestinal: positive for reflux symptoms, negative for abdominal pain, change in bowel habits and nausea Neurological: positive for headaches, negative for dizziness, paresthesia and  weakness Behavioral/Psych: positive for anxiety and sleep disturbance, negative for excessive alcohol consumption and tobacco use    Objective:    BP 143/90 mmHg  Pulse 68  Temp(Src) 97.8 F (36.6 C) (Temporal)  Ht 5' 3.5" (1.613 m)  Wt 162 lb (73.483 kg)  BMI 28.24 kg/m2  SpO2 100% BP 143/90 mmHg  Pulse 68  Temp(Src) 97.8 F (36.6 C) (Temporal)  Ht 5' 3.5" (1.613 m)  Wt 162 lb (73.483 kg)  BMI 28.24 kg/m2  SpO2 100% General appearance: alert, cooperative, appears stated age and no distress Head: Normocephalic, without obvious abnormality, atraumatic Eyes: negative findings: lids and lashes normal and conjunctivae and sclerae normal Neck: no adenopathy, no carotid bruit, supple, symmetrical, trachea midline and thyroid not enlarged, symmetric, no tenderness/mass/nodules Lungs: clear to auscultation bilaterally Heart: regular rate and rhythm, S1, S2 normal, no murmur, click, rub or gallop    Assessment:Plan   1. Elevated blood pressure - CBC - Comprehensive metabolic panel - TSH - Magnesium - Microalbumin / creatinine urine ratio  2. Vitamin D deficiency - Vit D  25 hydroxy (rtn osteoporosis monitoring)  3. BMI 29.0-29.9,adult - Lipid panel - Hemoglobin A1c  4. Encounter for long-term (current) use of medications - Comprehensive metabolic panel  5. Insomnia - LORazepam (ATIVAN) 0.5 MG tablet; Take 1 tablet (0.5 mg total) by mouth 2 (two) times daily as needed for anxiety.  Dispense: 60 tablet; Refill: 5  6. Gastroesophageal reflux disease, esophagitis presence not specified - Dexlansoprazole 30 MG capsule; Take 1 capsule (30 mg total) by mouth daily.  Dispense: 90 capsule; Refill: 1  This pt has at least 3 chronic stable illnesses (GERD, insomnia, anxiety). 25  minutes was spent w/patient. See problem list for complete A&P See pt instructions. F/u 6 mos

## 2014-06-24 ENCOUNTER — Telehealth: Payer: Self-pay | Admitting: Nurse Practitioner

## 2014-06-24 DIAGNOSIS — E781 Pure hyperglyceridemia: Secondary | ICD-10-CM

## 2014-06-24 LAB — VITAMIN D 25 HYDROXY (VIT D DEFICIENCY, FRACTURES): VITD: 26.86 ng/mL — ABNORMAL LOW (ref 30.00–100.00)

## 2014-06-24 NOTE — Telephone Encounter (Signed)
pls call pt: Advise Need to repeat chol panel to verify results as they are very different from last test. pls schedule fasting lab appt. Will place future orders.  Also, Magnesium is borderline. She should take 250 mg at bedtime. Vit D low-take D3 5000 iu daily with meal.

## 2014-06-24 NOTE — Assessment & Plan Note (Addendum)
Will repeat to verify results. Added amylase & lipase to screen for pancreatitis. Given VLDL is high, there is likely increased risk for atherosclerotic disease. If truly elevated, will start fish oil & fenofibrate. RF: unopposed estrogen, currently on highest dose.

## 2014-06-27 NOTE — Telephone Encounter (Signed)
Patient spoke to [place].

## 2014-06-27 NOTE — Telephone Encounter (Signed)
pls call pt: Advise Low magnesium can cause muscle cramps, interfere with sleep, interfere with calcium absorption, contribute to constipation.

## 2014-06-27 NOTE — Telephone Encounter (Signed)
Spoke with pt, advised lab results and message from [place]. Pt made an appt for tomorrow to repeat labs. She would also like to know how how a low magnesium effect your body. I was unable to explain this.

## 2014-06-28 ENCOUNTER — Other Ambulatory Visit (INDEPENDENT_AMBULATORY_CARE_PROVIDER_SITE_OTHER): Payer: BC Managed Care – PPO

## 2014-06-28 DIAGNOSIS — E781 Pure hyperglyceridemia: Secondary | ICD-10-CM

## 2014-06-28 LAB — C-REACTIVE PROTEIN: CRP: 1.8 mg/dL (ref 0.5–20.0)

## 2014-06-28 LAB — LIPASE: LIPASE: 24 U/L (ref 11.0–59.0)

## 2014-06-28 LAB — AMYLASE: Amylase: 50 U/L (ref 27–131)

## 2014-06-28 LAB — LIPID PANEL
Cholesterol: 245 mg/dL — ABNORMAL HIGH (ref 0–200)
HDL: 45.9 mg/dL (ref >39.00)
NonHDL: 199.1
Total CHOL/HDL Ratio: 5
Triglycerides: 617 mg/dL — ABNORMAL HIGH (ref 0.0–149.0)
VLDL: 123.4 mg/dL — AB (ref 0.0–40.0)

## 2014-06-29 LAB — LDL CHOLESTEROL, DIRECT: Direct LDL: 119.2 mg/dL

## 2014-06-30 ENCOUNTER — Telehealth: Payer: Self-pay | Admitting: Nurse Practitioner

## 2014-06-30 NOTE — Telephone Encounter (Signed)
Patient notified of results and appt scheduled

## 2014-06-30 NOTE — Telephone Encounter (Signed)
pls call pt: Advise Repeat cholesterol panel is much better, but triglycerides still too high. Pls schedule OV to discuss treatment.

## 2014-07-19 ENCOUNTER — Ambulatory Visit (INDEPENDENT_AMBULATORY_CARE_PROVIDER_SITE_OTHER): Payer: BLUE CROSS/BLUE SHIELD | Admitting: Family Medicine

## 2014-07-19 DIAGNOSIS — Z23 Encounter for immunization: Secondary | ICD-10-CM

## 2014-07-19 DIAGNOSIS — Z111 Encounter for screening for respiratory tuberculosis: Secondary | ICD-10-CM

## 2014-07-21 ENCOUNTER — Ambulatory Visit: Payer: BLUE CROSS/BLUE SHIELD | Admitting: Family Medicine

## 2014-07-21 DIAGNOSIS — Z111 Encounter for screening for respiratory tuberculosis: Secondary | ICD-10-CM

## 2014-07-21 LAB — TB SKIN TEST
Induration: 0 mm
TB SKIN TEST: NEGATIVE

## 2014-07-22 ENCOUNTER — Ambulatory Visit: Payer: BLUE CROSS/BLUE SHIELD

## 2014-08-23 ENCOUNTER — Other Ambulatory Visit: Payer: Self-pay | Admitting: *Deleted

## 2014-08-23 DIAGNOSIS — G4700 Insomnia, unspecified: Secondary | ICD-10-CM

## 2014-08-23 MED ORDER — TRAZODONE HCL 100 MG PO TABS: 100.0000 mg | ORAL_TABLET | Freq: Every day | ORAL | Status: DC

## 2014-08-23 NOTE — Telephone Encounter (Signed)
Refill request for trazodone Last filled by MD on- 04/13/14 #30 x2 Last Appt: 06/23/2014 Next Appt: 12/30/2014  Please advise refill? [place] in [place] and [facility]?

## 2014-11-14 ENCOUNTER — Other Ambulatory Visit: Payer: Self-pay

## 2014-11-14 DIAGNOSIS — G4700 Insomnia, unspecified: Secondary | ICD-10-CM

## 2014-11-14 MED ORDER — TRAZODONE HCL 100 MG PO TABS: 100.0000 mg | ORAL_TABLET | Freq: Every day | ORAL | Status: DC

## 2014-11-14 NOTE — Telephone Encounter (Signed)
Please Advise Refill Request? Refill request for- Trazodone 100mg   Last filled by MD on - 08/25/14 Last Appt - 06/23/14        Next Appt - 12/30/14 [facility]

## 2014-11-15 ENCOUNTER — Other Ambulatory Visit: Payer: Self-pay

## 2014-11-15 DIAGNOSIS — G4700 Insomnia, unspecified: Secondary | ICD-10-CM

## 2014-11-15 MED ORDER — TRAZODONE HCL 100 MG PO TABS: 100.0000 mg | ORAL_TABLET | Freq: Every day | ORAL | Status: DC

## 2014-11-15 NOTE — Telephone Encounter (Signed)
We sent to wrong pharmacy. Resending to correct pharmacy. [facility] [address] [place]

## 2014-12-30 ENCOUNTER — Ambulatory Visit: Payer: BC Managed Care – PPO | Admitting: Nurse Practitioner

## 2015-02-13 ENCOUNTER — Other Ambulatory Visit: Payer: Self-pay | Admitting: *Deleted

## 2015-02-13 DIAGNOSIS — G4700 Insomnia, unspecified: Secondary | ICD-10-CM

## 2015-02-13 NOTE — Telephone Encounter (Signed)
RF request for trazodone LOV: 06/23/14 Next ov: None Last written: 11/15/14 #30 w/ 2RF Please advise. Thanks.

## 2015-02-14 MED ORDER — TRAZODONE HCL 100 MG PO TABS: 100.0000 mg | ORAL_TABLET | Freq: Every day | ORAL | Status: AC

## 2021-07-11 ENCOUNTER — Other Ambulatory Visit: Payer: Self-pay

## 2021-07-11 ENCOUNTER — Emergency Department (INDEPENDENT_AMBULATORY_CARE_PROVIDER_SITE_OTHER): Payer: BC Managed Care – PPO

## 2021-07-11 ENCOUNTER — Emergency Department
Admission: EM | Admit: 2021-07-11 | Discharge: 2021-07-11 | Disposition: A | Discharge status: Home / Self Care | Payer: BC Managed Care – PPO | Source: Home / Self Care | Attending: Emergency Medicine | Admitting: Emergency Medicine

## 2021-07-11 DIAGNOSIS — J019 Acute sinusitis, unspecified: Secondary | ICD-10-CM

## 2021-07-11 DIAGNOSIS — M26622 Arthralgia of left temporomandibular joint: Secondary | ICD-10-CM

## 2021-07-11 DIAGNOSIS — R059 Cough, unspecified: Secondary | ICD-10-CM

## 2021-07-11 DIAGNOSIS — U071 COVID-19: Secondary | ICD-10-CM | POA: Diagnosis not present

## 2021-07-11 DIAGNOSIS — R0602 Shortness of breath: Secondary | ICD-10-CM

## 2021-07-11 DIAGNOSIS — R509 Fever, unspecified: Secondary | ICD-10-CM | POA: Diagnosis not present

## 2021-07-11 MED ORDER — AMOXICILLIN-POT CLAVULANATE 875-125 MG PO TABS: 1.0000 | ORAL_TABLET | Freq: Two times a day (BID) | ORAL | 0 refills | Status: AC

## 2021-07-11 MED ORDER — IBUPROFEN 600 MG PO TABS: 600.0000 mg | ORAL_TABLET | Freq: Four times a day (QID) | ORAL | 0 refills | Status: AC | PRN

## 2021-07-11 MED ORDER — PREDNISONE 20 MG PO TABS: 40.0000 mg | ORAL_TABLET | Freq: Every day | ORAL | 0 refills | Status: AC

## 2021-07-11 MED ORDER — FLUTICASONE PROPIONATE 50 MCG/ACT NA SUSP: 2.0000 | Freq: Every day | NASAL | 0 refills | Status: AC

## 2021-07-11 MED ORDER — AEROCHAMBER PLUS MISC: 2 refills | Status: AC

## 2021-07-11 MED ORDER — ALBUTEROL SULFATE HFA 108 (90 BASE) MCG/ACT IN AERS: 1.0000 | INHALATION_SPRAY | RESPIRATORY_TRACT | 0 refills | Status: AC | PRN

## 2021-07-11 NOTE — ED Notes (Signed)
Pt in waiting area, a/o w/ RR of 20, O2 sat 95 on room air, HR 92 bpm.

## 2021-07-11 NOTE — ED Provider Notes (Signed)
HPI  SUBJECTIVE:  Brittney Townsend is a 50 y.o. female who presents with fevers to 101, body aches, headaches, nasal congestion, rhinorrhea, sinus pain and pressure, abdominal pain, nonproductive cough, shortness of breath after being diagnosed with COVID 1 week ago.  She states that she gets easily fatigued, also reports left ear pain with decreased hearing.  No otorrhea.  No facial swelling, postnasal drip.  She denies calf pain or swelling, lower extremity edema, hemoptysis, chest pain, pleuritic chest pain.  She has been taking DayQuil, NyQuil, Tylenol, ibuprofen and resting with improvement in her symptoms.  No aggravating factors.  Her last dose of antipyretic was within 6 hours of evaluation.  No antibiotics in the past month.  No double sickening.  She has had COVID 3 times, has a history of asthma/seasonal allergies.  No history of DVT/PE, diabetes, hypertension, TMJ arthralgia.  [place].   Past Medical History:  Diagnosis Date   Anxiety disorder    Bladder pain    GERD (gastroesophageal reflux disease)    H/O hiatal hernia    History of endometriosis    Hyperlipidemia 05/01/2011   Hypertriglyceridemia    IC (interstitial cystitis)    Insomnia 09/26/2012   Lower urinary tract symptoms (LUTS)    PONV (postoperative nausea and vomiting)    Seasonal allergies    Spondylolisthesis of lumbar region    Vitamin D deficiency    White coat syndrome without diagnosis of hypertension     Past Surgical History:  Procedure Laterality Date   CARPAL TUNNEL WITH CUBITAL TUNNEL Right 09-22-2009   CESAREAN SECTION  1998  &  08-16-2001   CYSTO WITH HYDRODISTENSION Right 09/17/2013   Procedure: CYSTOSCOPY/HYDRODISTENSION WITH INJECTION OF MARCAINE AND PYRIDIUM INTO BLADDER AND INJECT MARCAINE AND KENOLOG IN BLADDER BASE, INJECT MARCAIN,KENOLOG INTO RIGHT LEVATOR MUSCLE;  Surgeon: Kathi LudwigSigmund I Tannenbaum, MD;  [facility];  Service: Urology;  Laterality: Right;    CYSTO/ HOD/ INSTILLATION  1996   DILITATION & CURRETTAGE/HYSTROSCOPY WITH NOVASURE ABLATION  01-13-2004   ESOPHAGEAL MANOMETRY N/A 02/08/2013   Procedure: ESOPHAGEAL MANOMETRY (EM);  Surgeon: Mardella Laymanavid R Patterson, MD;  Location: [facility];  Service: Endoscopy;  Laterality: N/A;   LAPAROSCOPIC CHOLECYSTECTOMY  12-04-2012   LUMBAR FUSION  02-09-2008   L5 - S1   TOTAL ABDOMINAL HYSTERECTOMY W/ BILATERAL SALPINGOOPHORECTOMY  09-06-2006    Family History  Problem Relation Age of Onset   Hypertension Mother    Anxiety disorder Mother    Cancer Father    Hyperlipidemia Father    Diabetes Father        type 2   Depression Brother    Cancer Maternal Grandmother        Lung/ previous smoker   Hypertension Maternal Grandmother    Hyperlipidemia Maternal Grandmother    Other Maternal Grandfather        renal failure   Heart disease Maternal Grandfather    Hypertension Maternal Grandfather    Hyperlipidemia Maternal Grandfather    Colon polyps Maternal Grandfather    Diabetes Paternal Grandmother    Hypertension Paternal Grandmother    Hyperlipidemia Paternal Grandmother    Hypertension Paternal Grandfather    Hyperlipidemia Paternal Grandfather    Cancer Paternal Grandfather        skin   Obesity Son    Heart attack Paternal Aunt [id number]       smoker   Heart attack Paternal Uncle [id number]       smoker  Social History   Tobacco Use   Smoking status: Never   Smokeless tobacco: Never  Substance Use Topics   Alcohol use: Yes    Comment: rare, special occasions   Drug use: No    No current facility-administered medications for this encounter.  Current Outpatient Medications:    albuterol (VENTOLIN HFA) 108 (90 Base) MCG/ACT inhaler, Inhale 1-2 puffs into the lungs every 4 (four) hours as needed for wheezing or shortness of breath., Disp: 1 each, Rfl: 0   amoxicillin-clavulanate (AUGMENTIN) 875-125 MG tablet, Take 1 tablet by mouth 2 (two) times daily. X 7 days, Disp: 14 tablet, Rfl:  0   Estradiol 10 MCG TABS vaginal tablet, Place vaginally., Disp: , Rfl:    fluticasone (FLONASE) 50 MCG/ACT nasal spray, Place 2 sprays into both nostrils daily., Disp: 16 g, Rfl: 0   ibuprofen (ADVIL) 600 MG tablet, Take 1 tablet (600 mg total) by mouth every 6 (six) hours as needed., Disp: 30 tablet, Rfl: 0   Multiple Vitamin (MULTIVITAMIN) tablet, Take 1 tablet by mouth daily., Disp: , Rfl:    predniSONE (DELTASONE) 20 MG tablet, Take 2 tablets (40 mg total) by mouth daily with breakfast for 5 days., Disp: 10 tablet, Rfl: 0   Probiotic Product (PRO-FLORA CONCENTRATE) CAPS, Take 1 capsule by mouth every evening. , Disp: , Rfl:    Spacer/Aero-Holding Chambers (AEROCHAMBER PLUS) inhaler, Use with inhaler, Disp: 1 each, Rfl: 2   topiramate (TOPAMAX) 15 MG capsule, Take 15 mg by mouth 2 (two) times daily., Disp: , Rfl:    traZODone (DESYREL) 100 MG tablet, Take 1 tablet (100 mg total) by mouth at bedtime., Disp: 30 tablet, Rfl: 2   Cholecalciferol (VITAMIN D3) 1000 UNITS CAPS, Take 1 capsule by mouth daily., Disp: , Rfl:    clindamycin-benzoyl peroxide (BENZACLIN) gel, Apply topically at bedtime as needed., Disp: 25 g, Rfl: 2   Dexlansoprazole 30 MG capsule, Take 1 capsule (30 mg total) by mouth daily., Disp: 90 capsule, Rfl: 1   estrogens, conjugated, (PREMARIN) 1.25 MG tablet, TAKE 1 TABLET (1.25 MG TOTAL) BY MOUTH DAILY.-  TAKES IN PM, Disp: , Rfl:    LORazepam (ATIVAN) 0.5 MG tablet, Take 1 tablet (0.5 mg total) by mouth 2 (two) times daily as needed for anxiety., Disp: 60 tablet, Rfl: 5   SUMAtriptan (IMITREX) 100 MG tablet, Take 1 tablet (100 mg total) by mouth once. May repeat in 2 hours if headache persists or recurs. Do not exceed more than 2 T in 24h., Disp: 10 tablet, Rfl: 1   URELLE (URELLE/URISED) 81 MG TABS tablet, Take 1 tablet (81 mg total) by mouth 3 (three) times daily as needed., Disp: 90 each, Rfl: 5  Allergies  Allergen Reactions   Sulfa Antibiotics Hives     ROS  As  noted in HPI.   Physical Exam  BP 108/73 (BP Location: Left Arm)    Pulse 87    Temp 98.8 F (37.1 C)    Resp 18    Ht 5\' 3"  (1.6 m)    Wt 70.8 kg    SpO2 97%    BMI 27.63 kg/m   Constitutional: Well developed, well nourished, no acute distress Eyes:  EOMI, conjunctiva normal bilaterally HENT: Normocephalic, atraumatic,mucus membranes moist.  Purulent nasal congestion.  Normal turbinates.  No maxillary, frontal sinus tenderness.  Positive postnasal drip.  TMs normal bilaterally.  Hearing equal bilaterally.  Left TMJ tenderness, crepitus.  Tenderness with palpation of left tragus.  No tenderness with  palpation of mastoid, traction on pinna. Respiratory: Normal inspiratory effort, lungs clear bilaterally Cardiovascular: Normal rate, regular rhythm, no murmurs, rubs, gallop GI: nondistended skin: No rash, skin intact Musculoskeletal: Calves symmetric, nontender, no edema Neurologic: Alert & oriented x 3, no focal neuro deficits Psychiatric: Speech and behavior appropriate   ED Course   Medications - No data to display  Orders Placed This Encounter  Procedures   DG Chest 2 View    Standing Status:   Standing    Number of Occurrences:   1    Order Specific Question:   Reason for Exam (SYMPTOM  OR DIAGNOSIS REQUIRED)    Answer:   covid 1 week ago SOB cough fever rule out pneumonia    Order Specific Question:   Is patient pregnant?    Answer:   No    No results found for this or any previous visit (from the past 24 hour(s)). DG Chest 2 View  Result Date: 07/11/2021 CLINICAL DATA:  Cough, shortness of breath, fever EXAM: CHEST - 2 VIEW COMPARISON:  05/21/2004 FINDINGS: Cardiac size is within normal limits. There are no signs of pulmonary edema or focal pulmonary consolidation. There is small linear radiopacity in the left lower lung fields adjacent to the dome of left hemidiaphragm. There is no pleural effusion or pneumothorax. IMPRESSION: There is no focal pulmonary consolidation.  There is no pleural effusion or pneumothorax. Small transverse linear density in the left lower lung fields may suggest minimal scarring or minimal subsegmental atelectasis. Electronically Signed   By: Ernie Avena M.D.   On: 07/11/2021 18:35    ED Clinical Impression  1. COVID-19 virus infection   2. Shortness of breath   3. Acute non-recurrent sinusitis, unspecified location   4. Arthralgia of left temporomandibular joint      ED Assessment/Plan  [place] Narcotic database reviewed for this patient, and feel that the risk/benefit ratio today is favorable for proceeding with a prescription for controlled substance.  Reviewed imaging independently.  No effusion, pneumothorax, focal pulmonary consolidation.  Small transverse linear density left lower lung field suggestive of minimal scarring or minimal subsegmental atelectasis.  See radiology report for full details.  Suspect COVID causing shortness of breath.  Will send home with an albuterol inhaler with spacer, scheduled for the next 4 days, then as needed, prednisone 40 mg for 5 days.  PE low in the differential with absence of tachycardia, hypoxia, pleuritic chest pain, although discussed the possibility of PE with the patient.  Also suspect secondary sinus infection.  Augmentin, Flonase, Mucinex D.  She has left ear pain, but she has crepitus and tenderness at the TMJ.  Soft diet, Tylenol/ibuprofen.  Will have patient follow-up with [facility] primary care, will also order assistance in finding a PMD.  Strict ER return precautions given .  Discussed imaging, MDM, treatment plan, and plan for follow-up with patient. Discussed sn/sx that should prompt return to the ED. patient agrees with plan.   Meds ordered this encounter  Medications   amoxicillin-clavulanate (AUGMENTIN) 875-125 MG tablet    Sig: Take 1 tablet by mouth 2 (two) times daily. X 7 days    Dispense:  14 tablet    Refill:  0   ibuprofen (ADVIL) 600 MG tablet    Sig:  Take 1 tablet (600 mg total) by mouth every 6 (six) hours as needed.    Dispense:  30 tablet    Refill:  0   fluticasone (FLONASE) 50 MCG/ACT nasal spray  Sig: Place 2 sprays into both nostrils daily.    Dispense:  16 g    Refill:  0   predniSONE (DELTASONE) 20 MG tablet    Sig: Take 2 tablets (40 mg total) by mouth daily with breakfast for 5 days.    Dispense:  10 tablet    Refill:  0   albuterol (VENTOLIN HFA) 108 (90 Base) MCG/ACT inhaler    Sig: Inhale 1-2 puffs into the lungs every 4 (four) hours as needed for wheezing or shortness of breath.    Dispense:  1 each    Refill:  0   Spacer/Aero-Holding Chambers (AEROCHAMBER PLUS) inhaler    Sig: Use with inhaler    Dispense:  1 each    Refill:  2    Please educate patient on use      *This clinic note was created using Dragon dictation software. Therefore, there may be occasional mistakes despite careful proofreading.  ?    Domenick Gong, MD 07/13/21 [phone]

## 2021-07-11 NOTE — Discharge Instructions (Addendum)
2 puffs from your albuterol inhaler every 4 hours for 2 days, then every 6 hours for 2 days, then as needed.  If you start to feel better sooner, you may back off on the albuterol.  Finish the prednisone, even if you feel better.  The Augmentin for sinus infection.  Flonase, Mucinex D, saline nasal irrigation with a [employer] sinus rinse and distilled water as often as you want.  Soft diet for the next few days.  Tylenol and ibuprofen will help with your TMJ/ear pain.

## 2021-07-11 NOTE — ED Triage Notes (Signed)
Pt states that she has some sob, fatigue, sore throat, nasal congestion, nausea, and body aches. X1 week   Pt states that she tested positive for covid. X1 week ago  Pt states that she doesn't feel any better.   Pt states that she is vaccinated for covid. Pt states that she hasn't had flu vaccine.

## 2023-02-15 IMAGING — DX DG CHEST 2V
2 series · 2 of 2 positions shown · non-contrast
Comparison: 05/21/2004

CLINICAL DATA: Cough, shortness of breath, fever

EXAM:
CHEST - 2 VIEW

[chest pa]
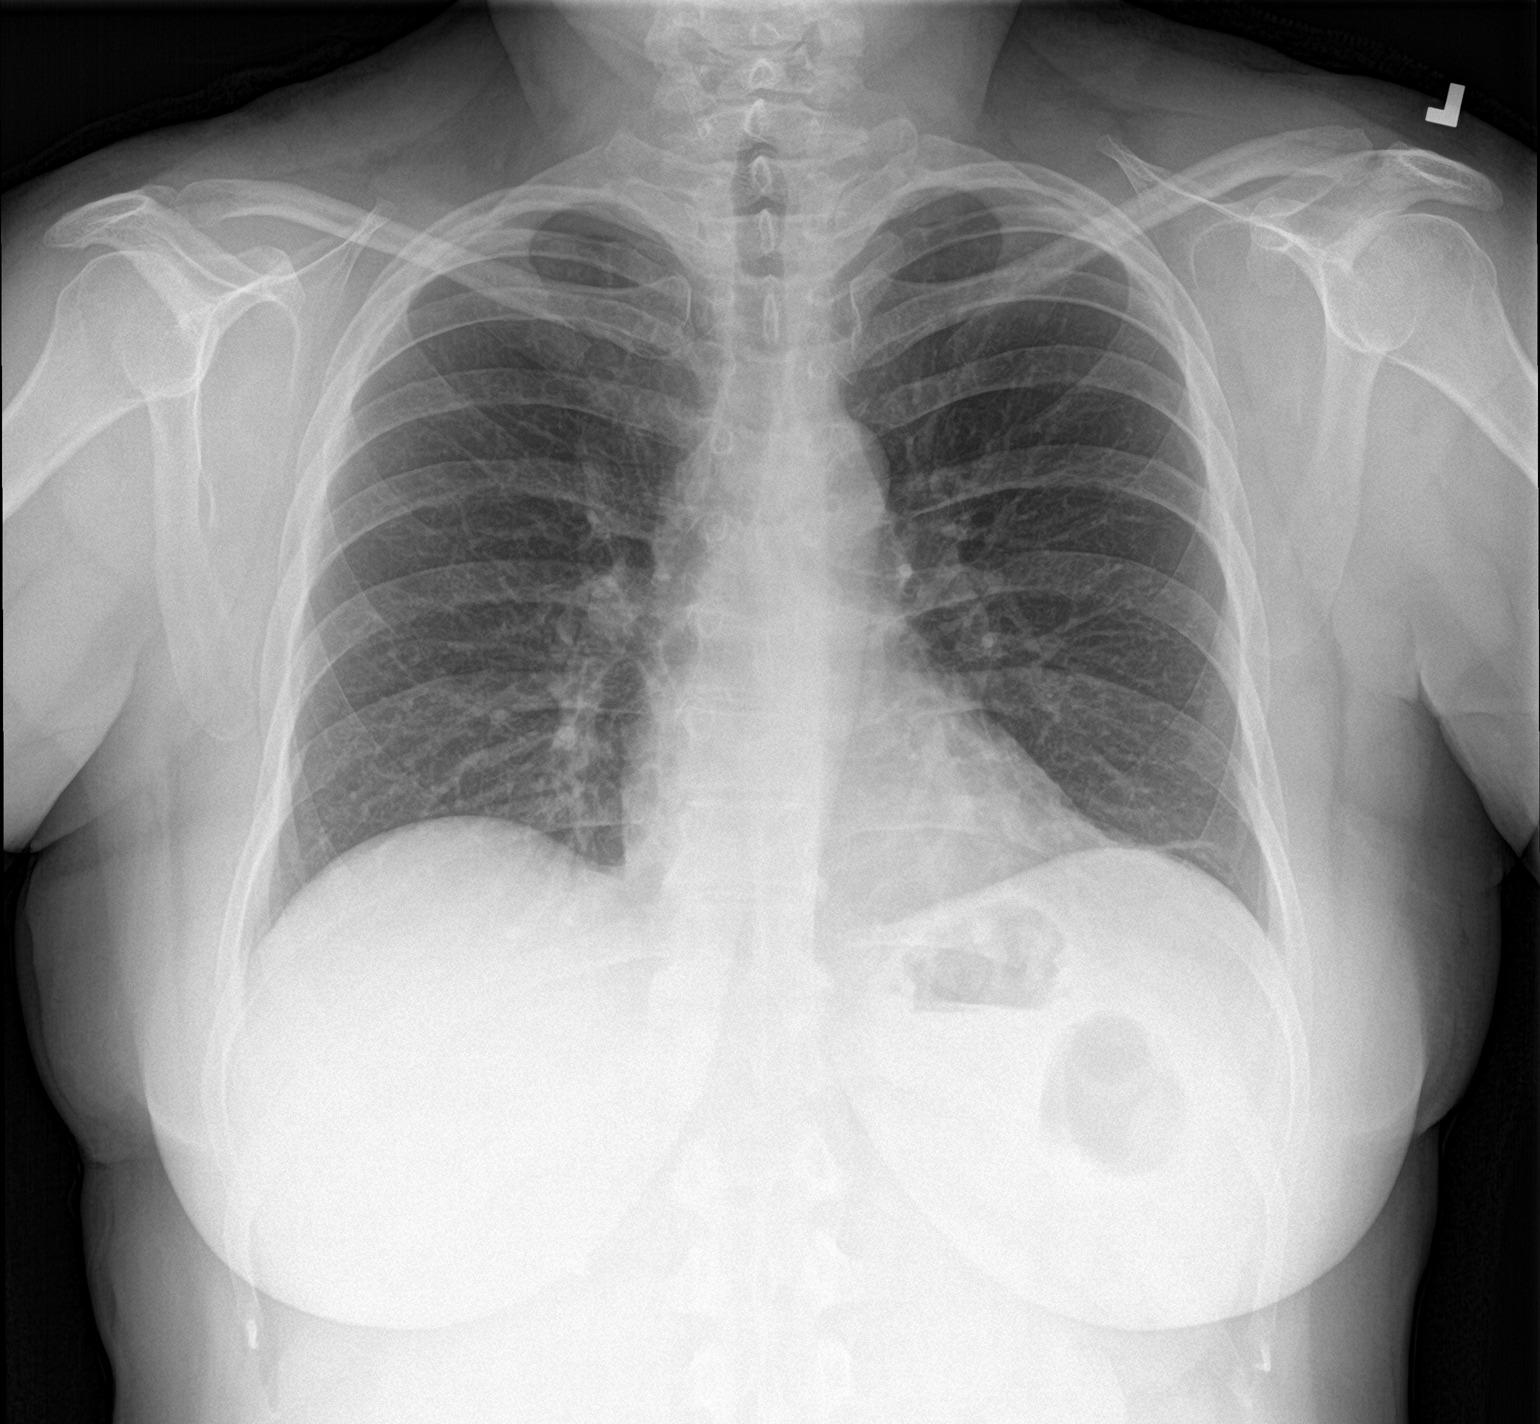

[chest lat]
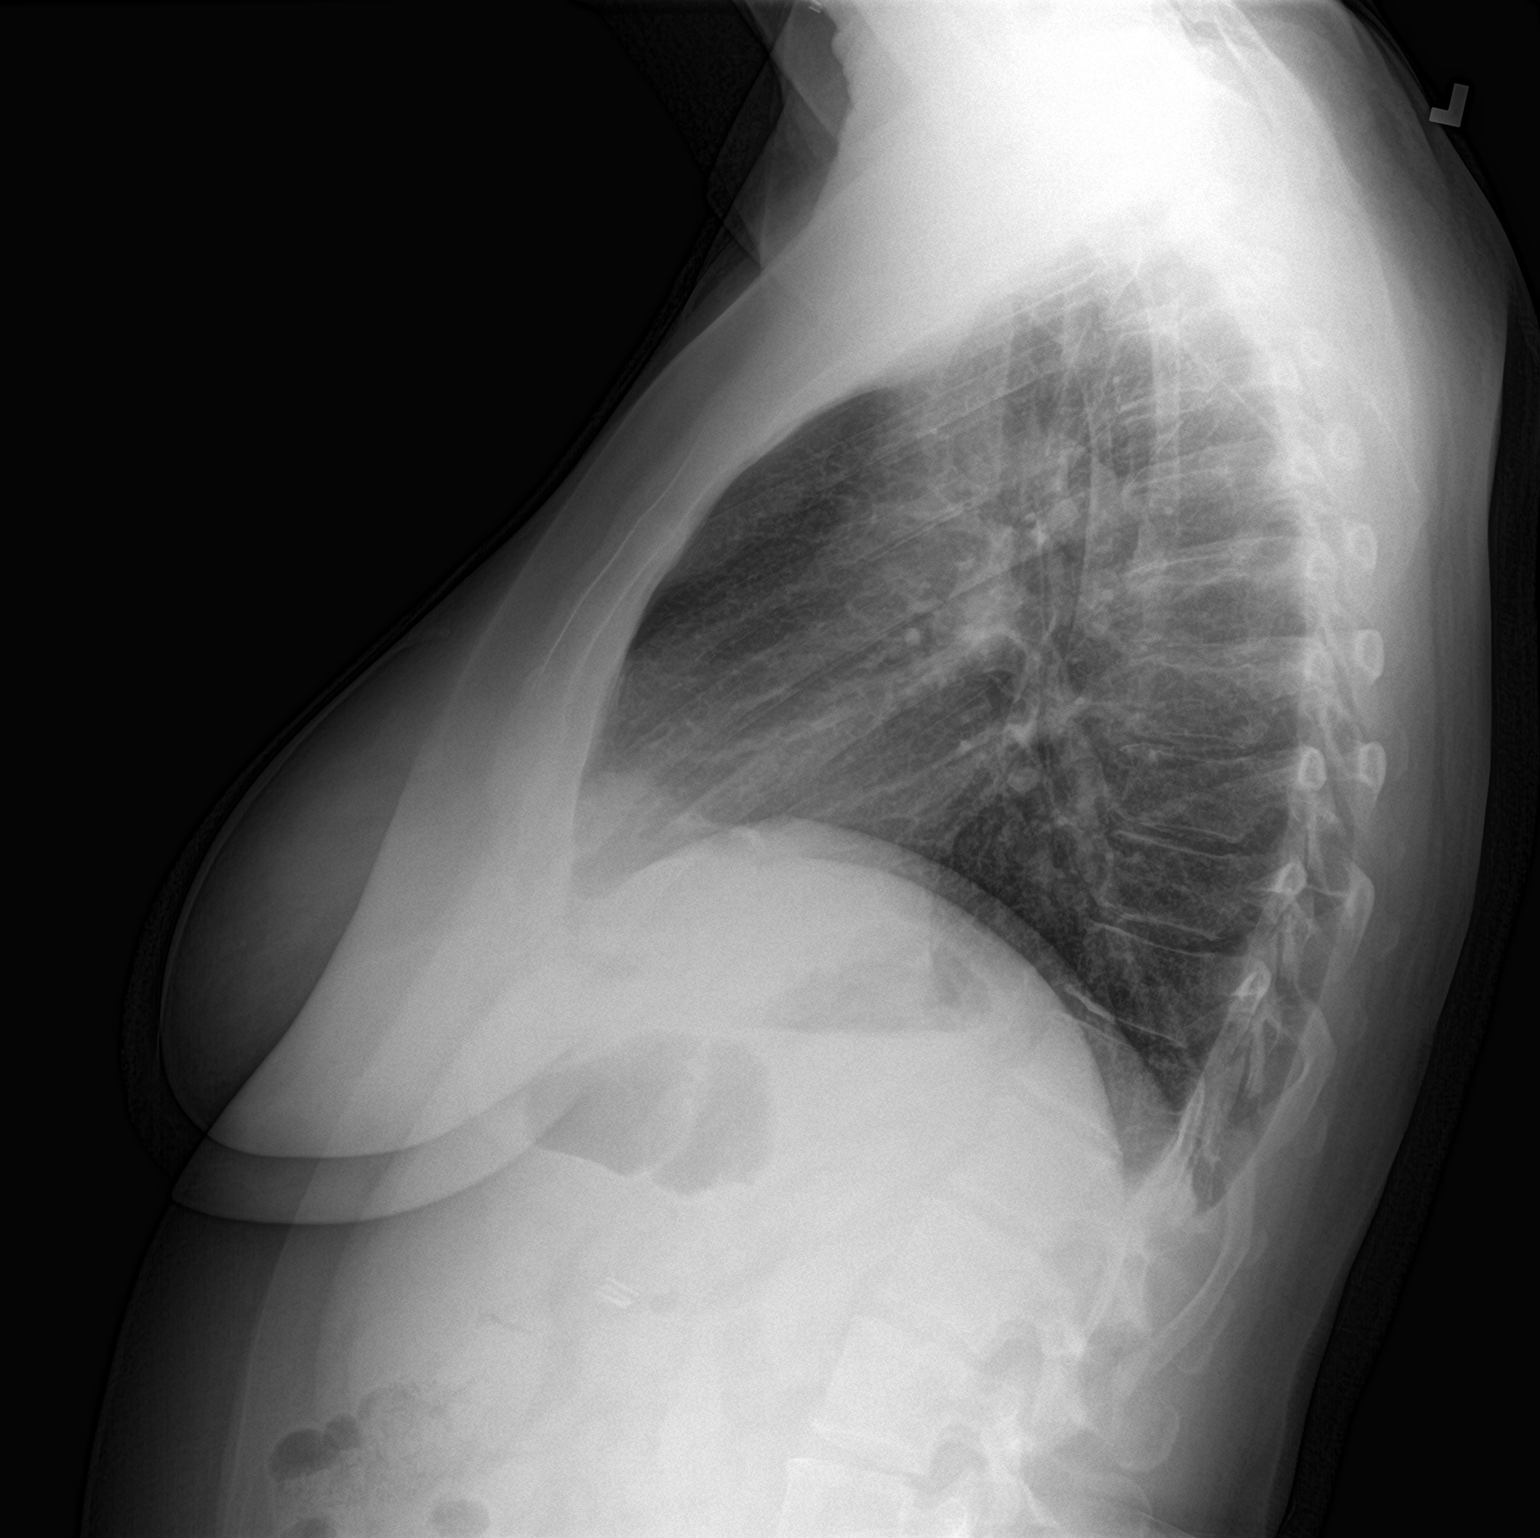

[2 of 2 positions shown; findings below may reference images not displayed]

FINDINGS: Cardiac size is within normal limits. There are no signs of
pulmonary edema or focal pulmonary consolidation. There is small
linear radiopacity in the left lower lung fields adjacent to the
dome of left hemidiaphragm. There is no pleural effusion or
pneumothorax.
IMPRESSION: There is no focal pulmonary consolidation. There is no pleural
effusion or pneumothorax. Small transverse linear density in the
left lower lung fields may suggest minimal scarring or minimal
subsegmental atelectasis.
